# Patient Record
Sex: Male | Born: 1965 | Race: White | Hispanic: No | Marital: Single | Smoking: Current every day smoker
Health system: Southern US, Community
[De-identification: ages and names within clinical notes are randomized; demographics above are authoritative.]

## PROBLEM LIST (undated history)

## (undated) DIAGNOSIS — K7010 Alcoholic hepatitis without ascites: Secondary | ICD-10-CM

## (undated) DIAGNOSIS — K7030 Alcoholic cirrhosis of liver without ascites: Secondary | ICD-10-CM

## (undated) DIAGNOSIS — Z720 Tobacco use: Secondary | ICD-10-CM

## (undated) DIAGNOSIS — I10 Essential (primary) hypertension: Secondary | ICD-10-CM

## (undated) DIAGNOSIS — K259 Gastric ulcer, unspecified as acute or chronic, without hemorrhage or perforation: Secondary | ICD-10-CM

## (undated) DIAGNOSIS — D649 Anemia, unspecified: Secondary | ICD-10-CM

## (undated) DIAGNOSIS — R161 Splenomegaly, not elsewhere classified: Secondary | ICD-10-CM

## (undated) DIAGNOSIS — T39395A Adverse effect of other nonsteroidal anti-inflammatory drugs [NSAID], initial encounter: Secondary | ICD-10-CM

## (undated) DIAGNOSIS — D696 Thrombocytopenia, unspecified: Secondary | ICD-10-CM

## (undated) DIAGNOSIS — K760 Fatty (change of) liver, not elsewhere classified: Secondary | ICD-10-CM

## (undated) DIAGNOSIS — D689 Coagulation defect, unspecified: Secondary | ICD-10-CM

## (undated) DIAGNOSIS — F1010 Alcohol abuse, uncomplicated: Secondary | ICD-10-CM

## (undated) DIAGNOSIS — K922 Gastrointestinal hemorrhage, unspecified: Secondary | ICD-10-CM

## (undated) DIAGNOSIS — R739 Hyperglycemia, unspecified: Secondary | ICD-10-CM

## (undated) DIAGNOSIS — E46 Unspecified protein-calorie malnutrition: Secondary | ICD-10-CM

## (undated) DIAGNOSIS — E785 Hyperlipidemia, unspecified: Secondary | ICD-10-CM

## (undated) DIAGNOSIS — F418 Other specified anxiety disorders: Secondary | ICD-10-CM

## (undated) HISTORY — PX: NO PAST SURGERIES: SHX2092

## (undated) HISTORY — DX: Essential (primary) hypertension: I10

---

## 1998-04-20 ENCOUNTER — Emergency Department (HOSPITAL_COMMUNITY): Admission: EM | Admit: 1998-04-20 | Discharge: 1998-04-20 | Payer: Self-pay | Admitting: Emergency Medicine

## 2014-03-03 DIAGNOSIS — F1010 Alcohol abuse, uncomplicated: Secondary | ICD-10-CM

## 2014-03-03 DIAGNOSIS — F418 Other specified anxiety disorders: Secondary | ICD-10-CM

## 2014-03-03 HISTORY — DX: Alcohol abuse, uncomplicated: F10.10

## 2014-03-03 HISTORY — DX: Other specified anxiety disorders: F41.8

## 2014-03-22 ENCOUNTER — Encounter (HOSPITAL_BASED_OUTPATIENT_CLINIC_OR_DEPARTMENT_OTHER): Payer: Self-pay | Admitting: Emergency Medicine

## 2014-03-22 ENCOUNTER — Emergency Department (HOSPITAL_BASED_OUTPATIENT_CLINIC_OR_DEPARTMENT_OTHER)
Admission: EM | Admit: 2014-03-22 | Discharge: 2014-03-22 | Disposition: A | Discharge status: Home / Self Care | Payer: BC Managed Care – PPO | Attending: Emergency Medicine | Admitting: Emergency Medicine

## 2014-03-22 DIAGNOSIS — Z79899 Other long term (current) drug therapy: Secondary | ICD-10-CM | POA: Insufficient documentation

## 2014-03-22 DIAGNOSIS — E785 Hyperlipidemia, unspecified: Secondary | ICD-10-CM | POA: Insufficient documentation

## 2014-03-22 DIAGNOSIS — F17200 Nicotine dependence, unspecified, uncomplicated: Secondary | ICD-10-CM | POA: Insufficient documentation

## 2014-03-22 DIAGNOSIS — F411 Generalized anxiety disorder: Secondary | ICD-10-CM | POA: Insufficient documentation

## 2014-03-22 DIAGNOSIS — F419 Anxiety disorder, unspecified: Secondary | ICD-10-CM

## 2014-03-22 DIAGNOSIS — R6889 Other general symptoms and signs: Secondary | ICD-10-CM | POA: Insufficient documentation

## 2014-03-22 DIAGNOSIS — F329 Major depressive disorder, single episode, unspecified: Secondary | ICD-10-CM | POA: Insufficient documentation

## 2014-03-22 DIAGNOSIS — F3289 Other specified depressive episodes: Secondary | ICD-10-CM | POA: Insufficient documentation

## 2014-03-22 HISTORY — DX: Hyperlipidemia, unspecified: E78.5

## 2014-03-22 NOTE — ED Notes (Signed)
Pt c/o anxious and depressed,  Increased ETOh x 1 month  Denies SI and HI

## 2014-03-22 NOTE — Discharge Instructions (Signed)
Panic Attacks °Panic attacks are sudden, short-lived surges of severe anxiety, fear, or discomfort. They may occur for no reason when you are relaxed, when you are anxious, or when you are sleeping. Panic attacks may occur for a number of reasons:  °· Healthy people occasionally have panic attacks in extreme, life-threatening situations, such as war or natural disasters. Normal anxiety is a protective mechanism of the body that helps us react to danger (fight or flight response). °· Panic attacks are often seen with anxiety disorders, such as panic disorder, social anxiety disorder, generalized anxiety disorder, and phobias. Anxiety disorders cause excessive or uncontrollable anxiety. They may interfere with your relationships or other life activities. °· Panic attacks are sometimes seen with other mental illnesses such as depression and posttraumatic stress disorder. °· Certain medical conditions, prescription medicines, and drugs of abuse can cause panic attacks. °SYMPTOMS  °Panic attacks start suddenly, peak within 20 minutes, and are accompanied by four or more of the following symptoms: °· Pounding heart or fast heart rate (palpitations). °· Sweating. °· Trembling or shaking. °· Shortness of breath or feeling smothered. °· Feeling choked. °· Chest pain or discomfort. °· Nausea or strange feeling in your stomach. °· Dizziness, lightheadedness, or feeling like you will faint. °· Chills or hot flushes. °· Numbness or tingling in your lips or hands and feet. °· Feeling that things are not real or feeling that you are not yourself. °· Fear of losing control or going crazy. °· Fear of dying. °Some of these symptoms can mimic serious medical conditions. For example, you may think you are having a heart attack. Although panic attacks can be very scary, they are not life threatening. °DIAGNOSIS  °Panic attacks are diagnosed through an assessment by your health care provider. Your health care provider will ask questions  about your symptoms, such as where and when they occurred. Your health care provider will also ask about your medical history and use of alcohol and drugs, including prescription medicines. Your health care provider may order blood tests or other studies to rule out a serious medical condition. Your health care provider may refer you to a mental health professional for further evaluation. °TREATMENT  °· Most healthy people who have one or two panic attacks in an extreme, life-threatening situation will not require treatment. °· The treatment for panic attacks associated with anxiety disorders or other mental illness typically involves counseling with a mental health professional, medicine, or a combination of both. Your health care provider will help determine what treatment is best for you. °· Panic attacks due to physical illness usually goes away with treatment of the illness. If prescription medicine is causing panic attacks, talk with your health care provider about stopping the medicine, decreasing the dose, or substituting another medicine. °· Panic attacks due to alcohol or drug abuse goes away with abstinence. Some adults need professional help in order to stop drinking or using drugs. °HOME CARE INSTRUCTIONS  °· Take all your medicines as prescribed.   °· Check with your health care provider before starting new prescription or over-the-counter medicines. °· Keep all follow up appointments with your health care provider. °SEEK MEDICAL CARE IF: °· You are not able to take your medicines as prescribed. °· Your symptoms do not improve or get worse. °SEEK IMMEDIATE MEDICAL CARE IF:  °· You experience panic attack symptoms that are different than your usual symptoms. °· You have serious thoughts about hurting yourself or others. °· You are taking medicine for panic attacks and   have a serious side effect. °MAKE SURE YOU: °· Understand these instructions. °· Will watch your condition. °· Will get help right away  if you are not doing well or get worse. °Document Released: 10/20/2005 Document Revised: 08/10/2013 Document Reviewed: 06/03/2013 °ExitCare® Patient Information ©2014 [employer]. ° ° ° °Emergency Department Resource Guide °1) Find a Doctor and Pay Out of Pocket °Although you won't have to find out who is covered by your insurance plan, it is a good idea to ask around and get recommendations. You will then need to call the office and see if the doctor you have chosen will accept you as a new patient and what types of options they offer for patients who are self-pay. Some doctors offer discounts or will set up payment plans for their patients who do not have insurance, but you will need to ask so you aren't surprised when you get to your appointment. ° °2) Contact Your Local Health Department °Not all health departments have doctors that can see patients for sick visits, but many do, so it is worth a call to see if yours does. If you don't know where your local health department is, you can check in your phone book. The CDC also has a tool to help you locate your state's health department, and many state websites also have listings of all of their local health departments. ° °3) Find a Walk-in Clinic °If your illness is not likely to be very severe or complicated, you may want to try a walk in clinic. These are popping up all over the country in pharmacies, drugstores, and shopping centers. They're usually staffed by nurse practitioners or physician assistants that have been trained to treat common illnesses and complaints. They're usually fairly quick and inexpensive. However, if you have serious medical issues or chronic medical problems, these are probably not your best option. ° °No Primary Care Doctor: °- Call [facility] at  832-8000 - they can help you locate a primary care doctor that  accepts your insurance, provides certain services, etc. °- Physician Referral Service- 1-800-533-3463 ° °Chronic Pain  Problems: °Organization         Address  Phone   Notes  ° [facility]  (336) 297-2271 Patients need to be referred by their primary care doctor.  ° °Medication Assistance: °Organization         Address  Phone   Notes  °[place] [facility] [address] °[place], [place] [address] (336) 641-8030 --Must be a resident of [place] °-- Must have NO insurance coverage whatsoever (no Medicaid/ Medicare, etc.) °-- The pt. MUST have a primary care doctor that directs their care regularly and follows them in the community °  °[facility]  (866) 331-1348   °[employer]  (888) 892-1162   ° °Agencies that provide inexpensive medical care: °Organization         Address  Phone   Notes  °[facility]  (336) 832-8035   °[facility]    (336) 832-7272   °[facility] [address] °[place], [place] [address] (336) 832-4777   °[facility] [address], °[place] (336) 271-4999   °[facility]    (336) 373-0678   °[facility]    (336) 272-1050   °[facility] ° [address], [place] Phone:  (336) 832-4444, Fax:  (336) 832-4440 Hours of Operation:  9 am -   6 pm, M-F.  Also accepts Medicaid/Medicare and self-pay.  °[facility] ° [address], [place] Phone: (336) 832-3150, Fax: (336) 832-3151. Hours of Operation:  8:30 am - 5:30 pm, M-F.  Also accepts Medicaid and self-pay.  °[facility] [place] [address], [place] Phone: (336) 878-6027   °[facility] [address], [place], [place] (336)723-1848, Ext. 123 Mondays & Thursdays: 7-9 AM.  First 15 patients are seen on a first come, first serve basis. °  ° °Medicaid-accepting [place] Providers: ° °Organization         Address  Phone   Notes  °[facility] [address], [place] (336) 641-2100 Also  accepts self-pay patients.  °[facility] [address], [place] ° (336) 856-9996   °[facility] [address], [place] (336) 288-8857   °[facility] [address] [place] [address], [place] (336) 299-7000   °Veita Bland [address], [place]  ° (336) 373-1557 Only accepts [place] Access Medicaid patients after they have their name applied to their card.  ° °Self-Pay (no insurance) in [place]: ° °Organization         Address  Phone   Notes  °Sickle Cell Patients, [facility] [address], [place] (336) 832-1970   °[facility] [address], [place] (336) 832-4400   °[facility] ° [address], [place] (336) 992-4800   °[facility]/Dr. Osei-Bonsu ° [address] [place] [address], [place] or [address], [place] (336) 841-8500 Phone number for both [place] and [place] locations is the same.  °[facility] [address], [place] (336) 299-0000   °[facility] [address] [place] [address], [place] or [address] (336) 852-7530 °(336) 878-2260   °[facility] [address], [place] (336) 350-1642, phone; (336) 294-5005, fax Sees patients 1st and 3rd Saturday of every month.  Must not qualify for public or private insurance (i.e. Medicaid, Medicare, [place] Health Choice, Veterans' Benefits) • Household income should be no more than 200% of the poverty level •The clinic cannot treat you if you are pregnant or think you are pregnant • Sexually transmitted diseases are not treated at the clinic.  ° ° °Dental Care: °Organization         Address  Phone  Notes  °[facility] [address], [place] (336) 641-6152 Accepts children up to age 21 who are enrolled in Medicaid or [place] Health Choice; pregnant  women with a Medicaid card; and children who have applied for Medicaid or [place] Health Choice, but were declined, whose parents can pay a reduced fee at time of service.  °[facility] [place]  [address], [place] (336) 641-7733 Accepts children up to age 21 who are enrolled in Medicaid or [place] Health Choice; pregnant women with a Medicaid card; and children who have applied for Medicaid or [place] Health Choice, but were declined, whose parents can pay a reduced fee at time of service.  °[facility] ° [address], [place] (336) 641-4533 Patients are seen by appointment only. Walk-ins are not accepted. [facility] will see patients 18 years of age and   older. °Monday - Tuesday (8am-5pm) °Most Wednesdays (8:30-5pm) °$30 per visit, cash only  °[facility] ° [address], [place] (336) 641-4533 Patients are seen by appointment only. Walk-ins are not accepted. [facility] will see patients 18 years of age and older. °One Wednesday Evening (Monthly: Volunteer Based).  $30 per visit, cash only  °[facility]  (919) 537-3737 for adults; Children under age 4, call Graduate Pediatric Dentistry at (919) [phone]. Children aged 4-14, please call (919) [phone] to request a pediatric application. ° Dental services are provided in all areas of dental care including fillings, crowns and bridges, complete and partial dentures, implants, gum treatment, root canals, and extractions. Preventive care is also provided. Treatment is provided to both adults and children. °Patients are selected via a lottery and there is often a waiting list. °  °[facility] [address], °[place] ° (336) 763-8833 [email or web address] °  °[facility] [address], [place], [place] (336)723-1848, Ext. 123 Second and Fourth Thursday of each month, opens at 6:30 AM; Clinic ends at 9 AM.  Patients are  seen on a first-come first-served basis, and a limited number are seen during each clinic.  ° °[facility] ° [address], [place], [place] (336) 723-7904   Eligibility Requirements °You must have lived in [place], [place], or [place] counties for at least the last three months. °  You cannot be eligible for state or federal sponsored healthcare insurance, including Veterans Administration, Medicaid, or Medicare. °  You generally cannot be eligible for healthcare insurance through your employer.  °  How to apply: °Eligibility screenings are held every Tuesday and Wednesday afternoon from 1:00 pm until 4:00 pm. You do not need an appointment for the interview!  °[facility] [address], [place], [place] 336-631-2330   °[facility]  336-342-8273   °[facility]  336-703-3100   °[facility]  336-570-6415   ° °Behavioral Health Resources in the Community: °Intensive Outpatient Programs °Organization         Address  Phone  Notes  °[facility] [address], [place], [place] 336-878-6098   °[facility] [address], [place], [place] 336-832-9800   °[facility] [address], [place], [place] ° 336-882-2125   °[facility] [address],  °[place], [place] 1-800-853-5163 or 336-641-4981   °Substance Abuse Resources °Organization         Address  Phone  Notes  °[facility]  336-882-2125   °[facility]  336-784-9470   °[facility]  336-285-9073   °[facility]  336-845-3988   °Residential & Outpatient Substance Abuse Program  1-800-659-3381   °Psychological Services °Organization         Address  Phone  Notes  °[facility]  336- 832-9600   °[facility]  336- 378-7881   °[facility] [address], [place] 1-800-853-5163 or 336-641-4981   ° °Mobile Crisis  Teams °Organization         Address  Phone  Notes  °[facility], Mobile Crisis Care Unit  1-877-626-1772   °Assertive °[facility] ° [address] [place], [place] 336-834-9664   °Sharon DeEsch [address] °[place]  336-554-5454   ° °Self-Help/Support Groups °Organization         Address    Phone             Notes  °[facility] - variety of support groups  336- 373-1402 Call for more information  °Narcotics Anonymous (NA), [facility] [address], °[place]  2 meetings at this location  ° °Residential Treatment Programs °Organization         Address  Phone  Notes  °[facility] [address],    °[place]  1-866-801-8205   °[facility] ° [address], [place], [place] 704-293-8524   °[facility] [address], [place] 336-845-3988 Admissions: 8am-3pm M-F  °[facility] [address]    °[place], [place] 336-841-1104   °[facility] [address], [place], [place] 336-379-7146   °[facility] [address]  °[place], [place] 336-285-9073   °[facility] - Intensive Outpatient [address], [place], [place] 336-852-3033   °[facility] [address]  °[place], [place] 1-877-615-2722 or 336-784-9470   °[facility] [address] [place], [place] 336-227-7417 Accepts Medicaid  °[facility] [address]  ° [place] 1-800-659-3381 Substance Abuse/Addiction Treatment  ° °[place] Behavioral Health Resources °Organization         Address  Phone  Notes  °[facility]  (888) 581-9988   °Julie Brannon, PhD [address] [place], [place]   (336) 349-5553 or (336) 951-0000   °[facility]   [address] °[place], [place] (336) 349-4454   °[facility] [address], [place], [place] (336) 342-8316  Insurance/Medicaid/sponsorship through [facility]  °[facility] [address]                                    [place], [place] (336) 342-8316 Therapy/tele-psych/case  °[facility] [address]  ° [place], [place] (336) 349-2233    °Dr. Arfeen  (336) 349-4544   °[facility] [place]  [employer] [place] [facility] 1) [address],  °2) [address], [place] °3)  [address], [place] (336) 349-3220 °(336) 342-7768 ° °(336) 342-8140   °[place] [facility] (336) 342-1394 or (336) 342-3537 (After Hours)    ° ° ° °

## 2014-03-22 NOTE — ED Provider Notes (Signed)
CSN: [id number]     Arrival date & time 03/22/14  1546 History   First MD Initiated Contact with Patient 03/22/14 1622     Chief Complaint  Patient presents with  . Anxiety     (Consider location/radiation/quality/duration/timing/severity/associated sxs/prior Treatment) Patient is a 48 y.o. male presenting with mental health disorder.  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  since that time, he has been more anxious, drinking more, smoking more, and recently has been unable to go back to work.   Relieved by:  Nothing Ineffective treatments: cymbalta. Associated symptoms: anhedonia, anxiety and feelings of worthlessness   Associated symptoms: no abdominal pain and no chest pain   Associated symptoms comment:  No hallucinations, no bizarre behavior, no SI.   Past Medical History  Diagnosis Date  . Hyperlipemia   . Anxiety    History reviewed. No pertinent past surgical history. History reviewed. No pertinent family history. History  Substance Use Topics  . Smoking status: Current Every Day Smoker -- 2.00 packs/day    Types: Cigarettes  . Smokeless tobacco: Not on file  . Alcohol Use: No    Review of Systems  Constitutional: Negative for fever.  HENT: Negative for congestion.   Respiratory: Negative for cough and shortness of breath.   Cardiovascular: Negative for chest pain.  Gastrointestinal: Negative for nausea, vomiting, abdominal pain and diarrhea.  Psychiatric/Behavioral: The patient is nervous/anxious.   All other systems reviewed and are negative.     Allergies  Review of patient's allergies indicates no known allergies.  Home Medications   Prior to Admission  medications   Medication Sig Start Date End Date Taking? Authorizing Provider  DULoxetine (CYMBALTA) 30 MG capsule Take 30 mg by mouth daily.   Yes Historical Provider, MD  simvastatin (ZOCOR) 20 MG tablet Take 20 mg by mouth daily.   Yes Historical Provider, MD   BP 161/100  Pulse 95  Temp(Src) 98.5 F (36.9 C) (Oral)  Resp 16  Ht 5\' 11"  (1.803 m)  Wt 150 lb (68.04 kg)  BMI 20.93 kg/m2  SpO2 100% Physical Exam  Nursing note and vitals reviewed. Constitutional: He is oriented to person, place, and time. He appears well-developed and well-nourished. No distress.  HENT:  Head: Normocephalic and atraumatic.  Eyes: Conjunctivae are normal. No scleral icterus.  Neck: Neck supple.  Cardiovascular: Normal rate and intact distal pulses.   Pulmonary/Chest: Effort normal. No stridor. No respiratory distress.  Abdominal: Normal appearance. He exhibits no distension.  Neurological: He is alert and oriented to person, place, and time.  Skin: Skin is warm and dry. No rash noted.  Psychiatric: His affect is blunt. He is withdrawn. He expresses no suicidal ideation. He expresses no suicidal plans.    ED Course  Procedures (including critical care time) Labs Review Labs Reviewed - No data to display  Imaging Review No results found.   EKG Interpretation None      MDM   Final diagnoses:  Anxiety    48 year old male with history of depression and anxiety presents with worsening symptoms over the past month. They seem to stem from an episode at work, which he won't explain in much detail.  He denies SI/HI, auditory or visual hallucinations,  and bizarre or paranoid behavior.  He does not have an indication for emergent psychiatric evaluation. However, have given him community resources to establish outpatient psychiatric care.    Candyce ChurnJohn David Nandana Krolikowski III, MD 03/22/14 [phone]

## 2015-09-04 DIAGNOSIS — R739 Hyperglycemia, unspecified: Secondary | ICD-10-CM

## 2015-09-04 DIAGNOSIS — K7010 Alcoholic hepatitis without ascites: Secondary | ICD-10-CM

## 2015-09-04 HISTORY — DX: Hyperglycemia, unspecified: R73.9

## 2015-09-04 HISTORY — DX: Alcoholic hepatitis without ascites: K70.10

## 2015-09-19 ENCOUNTER — Ambulatory Visit (INDEPENDENT_AMBULATORY_CARE_PROVIDER_SITE_OTHER): Payer: Self-pay | Admitting: Family Medicine

## 2015-09-19 VITALS — BP 120/86 | HR 96 | Temp 97.9°F | Resp 18 | Ht 71.0 in | Wt 153.0 lb

## 2015-09-19 DIAGNOSIS — G629 Polyneuropathy, unspecified: Secondary | ICD-10-CM

## 2015-09-19 DIAGNOSIS — F1020 Alcohol dependence, uncomplicated: Secondary | ICD-10-CM

## 2015-09-19 DIAGNOSIS — R270 Ataxia, unspecified: Secondary | ICD-10-CM

## 2015-09-19 LAB — POCT CBC
Granulocyte percent: 53.8 %G (ref 37–80)
HCT, POC: 34.2 % — AB (ref 43.5–53.7)
Hemoglobin: 12 g/dL — AB (ref 14.1–18.1)
Lymph, poc: 3.5 — AB (ref 0.6–3.4)
MCH, POC: 40.2 pg — AB (ref 27–31.2)
MCHC: 35.1 g/dL (ref 31.8–35.4)
MCV: 114.5 fL — AB (ref 80–97)
MID (cbc): 0.5 (ref 0–0.9)
MPV: 7.3 fL (ref 0–99.8)
POC Granulocyte: 4.6 (ref 2–6.9)
POC LYMPH PERCENT: 40.9 %L (ref 10–50)
POC MID %: 5.3 %M (ref 0–12)
Platelet Count, POC: 259 10*3/uL (ref 142–424)
RBC: 2.99 M/uL — AB (ref 4.69–6.13)
RDW, POC: 16.5 %
WBC: 8.5 10*3/uL (ref 4.6–10.2)

## 2015-09-19 MED ORDER — GABAPENTIN 100 MG PO CAPS: 100.0000 mg | ORAL_CAPSULE | Freq: Three times a day (TID) | ORAL | Status: DC

## 2015-09-19 MED ORDER — FLUOXETINE HCL 20 MG PO TABS: 20.0000 mg | ORAL_TABLET | Freq: Every day | ORAL | Status: AC

## 2015-09-19 NOTE — Progress Notes (Addendum)
 @  This chart was scribed for Bryan Sidle, MD by Bryan Moyer, ED Scribe. This patient was seen in room 1 and the patient's care was started at 7:38 PM.  Patient ID: Bryan Moyer MRN: [id number], DOB: [birth year], 49 y.o. Date of Encounter: 09/19/2015, 7:38 PM  Primary Physician: No PCP Per Patient  Chief Complaint:  Chief Complaint  Patient presents with   Dizziness    Pt. states that he can only take a few steps without getting dizziness   Gout    both feet, feet have sore for several days     HPI: 49 y.o. year old male with history below presents with multiple concerns.  He has not eaten well in a couple of days. He is currently unemployed and lives at home with his mom and sister He drinks vodka and goes through about a couple a week. Pt states he will start drinking in the afternoon. He has not tried to quit drinking. His sister feels that he drinks too much and drinking has worsened since losing his job,03/2014. He used to a Marine scientist. His sister feels as if pt has issues with anxiety since losing his job. He has been on cymbalta in the past. Pt has bilateral foot pain to the bottom of his toes. He believes this may be gout.   He also reports dizziness with walking short distance; feeling off balance.    Past Medical History  Diagnosis Date   Hyperlipemia    Anxiety    Depression    Hypertension      Home Meds: Prior to Admission medications   Medication Sig Start Date End Date Taking? Authorizing Provider  DULoxetine (CYMBALTA) 30 MG capsule Take 30 mg by mouth daily.    Historical Provider, MD  simvastatin (ZOCOR) 20 MG tablet Take 20 mg by mouth daily.    Historical Provider, MD    Allergies: No Known Allergies  Social History   Social History   Marital Status: Single    Spouse Name: N/A   Number of Children: N/A   Years of Education: N/A   Occupational History   Not on file.   Social History Main Topics   Smoking status:  Current Every Day Smoker -- 2.00 packs/day    Types: Cigarettes   Smokeless tobacco: Not on file   Alcohol Use: 16.8 oz/week    28 Standard drinks or equivalent per week   Drug Use: No   Sexual Activity: No   Other Topics Concern   Not on file   Social History Narrative     Review of Systems: Constitutional: negative for chills, fever, night sweats, weight changes, or fatigue  HEENT: negative for vision changes, hearing loss, congestion, rhinorrhea, ST, epistaxis, or sinus pressure Cardiovascular: negative for chest pain or palpitations Respiratory: negative for hemoptysis, wheezing, shortness of breath, or cough Abdominal: negative for abdominal pain, nausea, vomiting, diarrhea, or constipation Dermatological: negative for rash Neurologic: negative for headache, or syncope All other systems reviewed and are otherwise negative with the exception to those above and in the HPI.   Physical Exam:  Patient is ashen and weak Blood pressure 120/86, pulse 96, temperature 97.9 F (36.6 C), temperature source Oral, resp. rate 18, height  (1.803 m), weight 153 lb (69.4 kg), SpO2 98 %., Body mass index is 21.35 kg/(m^2). General: He is pale and skin is doughy with diffuse muscle wasting Head: Normocephalic, atraumatic, eyes without discharge, sclera non-icteric, nares are without discharge. Bilateral auditory canals  clear, TM's are without perforation, pearly grey and translucent with reflective cone of light bilaterally. Oral cavity moist, posterior pharynx without exudate, erythema, peritonsillar abscess, or post nasal drip.  Neck: Supple. No thyromegaly. Full ROM. No lymphadenopathy.  No JVD Lungs: Clear bilaterally to auscultation without wheezes, rales, or rhonchi. Breathing is unlabored. Heart: RRR with S1 S2. No murmurs, rubs, or gallops appreciated. Abdomen: Soft, non-tender, non-distended with normoactive bowel sounds. Liver is enlarged and irregular, firm and nontender.  No  obvious ascites. Msk:  Strength and tone normal for age. Extremities/Skin: Warm and dry. No clubbing or cyanosis. No edema. No rashes or suspicious lesions.  Nicotine stains on finger nails Neuro: Alert and oriented X 3. Moves all extremities spontaneously. Gait is normal. CNII-XII grossly in tact. Psych:  Responds to questions appropriately with a normal affect.   Labs: Results for orders placed or performed in visit on 09/19/15  POCT CBC  Result Value Ref Range   WBC 8.5 4.6 - 10.2 K/uL   Lymph, poc 3.5 (A) 0.6 - 3.4   POC LYMPH PERCENT 40.9 10 - 50 %L   MID (cbc) 0.5 0 - 0.9   POC MID % 5.3 0 - 12 %M   POC Granulocyte 4.6 2 - 6.9   Granulocyte percent 53.8 37 - 80 %G   RBC 2.99 (A) 4.69 - 6.13 M/uL   Hemoglobin 12.0 (A) 14.1 - 18.1 g/dL   HCT, POC 40.934.2 (A) 81.143.5 - 53.7 %   MCV 114.5 (A) 80 - 97 fL   MCH, POC 40.2 (A) 27 - 31.2 pg   MCHC 35.1 31.8 - 35.4 g/dL   RDW, POC 91.416.5 %   Platelet Count, POC 259 142 - 424 K/uL   MPV 7.3 0 - 99.8 fL      ASSESSMENT AND PLAN:  49 y.o. year old male with  Alcoholism. Tried to explain that all of his symptoms are coming from alcohol and that in order to get better he needs to go to facility that deals with alcoholics.  He is obviously weak and needs to make some serious decisions about how he wants to proceed.  Labs for liver will be drawn and follow up recommended according to the results. This chart was scribed in my presence and reviewed by me personally.    ICD-9-CM ICD-10-CM   1. Alcoholism (HCC) 303.90 F10.20 POCT CBC     COMPLETE METABOLIC PANEL WITH GFR     TSH  2. Neuropathy (HCC) 355.9 G62.9 POCT CBC     COMPLETE METABOLIC PANEL WITH GFR     TSH  3. Ataxia 781.3 R27.0        By signing my name below, I, Raven Small, attest that this documentation has been prepared under the direction and in the presence of Bryan SidleKurt Lauenstein, MD.  Electronically Signed: Andrew Auaven Small, ED Scribe. 09/19/2015. 7:49 PM.  Signed, Bryan SidleKurt  Lauenstein, MD 09/19/2015 7:38 PM

## 2015-09-19 NOTE — Patient Instructions (Addendum)
Your problems all stem from drinking heavily. If you want to problems to go away, he'll have to stop drinking alcohol.   As we discussed, if you discontinue alcohol suddenly , you have a very likely chance of getting a seizure. I strongly urge [facility] to [facility].   I'm prescribing some medicine for the neuropathy in your feet.  There are number of labs that are still pending and that should be back tomorrow or Friday.    your condition is serious. All do everything I can to help alleviate her anxiety and neuropathy but the ball squarely in your court as far as alcohol goes.   as I have mentioned, very important to start on vitamins. Centrum would be a good one to start with.

## 2015-09-20 ENCOUNTER — Other Ambulatory Visit: Payer: Self-pay | Admitting: Family Medicine

## 2015-09-20 ENCOUNTER — Telehealth: Payer: Self-pay

## 2015-09-20 DIAGNOSIS — E876 Hypokalemia: Secondary | ICD-10-CM

## 2015-09-20 LAB — COMPLETE METABOLIC PANEL WITH GFR
ALT: 38 U/L (ref 9–46)
AST: 91 U/L — ABNORMAL HIGH (ref 10–35)
Albumin: 2.9 g/dL — ABNORMAL LOW (ref 3.6–5.1)
Alkaline Phosphatase: 158 U/L — ABNORMAL HIGH (ref 40–115)
BUN: 5 mg/dL — ABNORMAL LOW (ref 7–25)
CO2: 29 mmol/L (ref 20–31)
Calcium: 8.7 mg/dL (ref 8.6–10.3)
Chloride: 89 mmol/L — ABNORMAL LOW (ref 98–110)
Creat: 0.55 mg/dL — ABNORMAL LOW (ref 0.70–1.33)
GFR, Est African American: >89 mL/min (ref >=60)
GFR, Est Non African American: >89 mL/min (ref >=60)
Glucose, Bld: 169 mg/dL — ABNORMAL HIGH (ref 65–99)
Potassium: 2.4 mmol/L — CL (ref 3.5–5.3)
Sodium: 133 mmol/L — ABNORMAL LOW (ref 135–146)
Total Bilirubin: 2.2 mg/dL — ABNORMAL HIGH (ref 0.2–1.2)
Total Protein: 6.2 g/dL (ref 6.1–8.1)

## 2015-09-20 LAB — TSH: TSH: 0.847 u[IU]/mL (ref 0.350–4.500)

## 2015-09-20 MED ORDER — POTASSIUM CHLORIDE CRYS ER 20 MEQ PO TBCR: 20.0000 meq | EXTENDED_RELEASE_TABLET | Freq: Every day | ORAL | Status: DC

## 2015-09-20 NOTE — Telephone Encounter (Signed)
[facility] called to report a critical lab value from yesterday. K+ value = 2.4. Spoke to Dr Milus GlazierLauenstein to advise, and will also forward this message.

## 2015-10-02 ENCOUNTER — Encounter (HOSPITAL_COMMUNITY): Payer: Self-pay | Admitting: Family Medicine

## 2015-10-02 ENCOUNTER — Inpatient Hospital Stay (HOSPITAL_COMMUNITY)
Admission: EM | Admit: 2015-10-02 | Discharge: 2015-10-05 | DRG: 312 | Disposition: A | Discharge status: Home / Self Care | Payer: Self-pay | Attending: Internal Medicine | Admitting: Internal Medicine

## 2015-10-02 DIAGNOSIS — R42 Dizziness and giddiness: Secondary | ICD-10-CM

## 2015-10-02 DIAGNOSIS — I951 Orthostatic hypotension: Principal | ICD-10-CM | POA: Diagnosis present

## 2015-10-02 DIAGNOSIS — E876 Hypokalemia: Secondary | ICD-10-CM | POA: Diagnosis present

## 2015-10-02 DIAGNOSIS — G621 Alcoholic polyneuropathy: Secondary | ICD-10-CM | POA: Diagnosis present

## 2015-10-02 DIAGNOSIS — Z8249 Family history of ischemic heart disease and other diseases of the circulatory system: Secondary | ICD-10-CM

## 2015-10-02 DIAGNOSIS — F17210 Nicotine dependence, cigarettes, uncomplicated: Secondary | ICD-10-CM | POA: Diagnosis present

## 2015-10-02 DIAGNOSIS — F1010 Alcohol abuse, uncomplicated: Secondary | ICD-10-CM | POA: Diagnosis present

## 2015-10-02 DIAGNOSIS — F329 Major depressive disorder, single episode, unspecified: Secondary | ICD-10-CM | POA: Diagnosis present

## 2015-10-02 DIAGNOSIS — M6250 Muscle wasting and atrophy, not elsewhere classified, unspecified site: Secondary | ICD-10-CM | POA: Diagnosis present

## 2015-10-02 DIAGNOSIS — R718 Other abnormality of red blood cells: Secondary | ICD-10-CM | POA: Diagnosis present

## 2015-10-02 DIAGNOSIS — R945 Abnormal results of liver function studies: Secondary | ICD-10-CM | POA: Diagnosis present

## 2015-10-02 DIAGNOSIS — N390 Urinary tract infection, site not specified: Secondary | ICD-10-CM | POA: Diagnosis present

## 2015-10-02 DIAGNOSIS — F32A Depression, unspecified: Secondary | ICD-10-CM

## 2015-10-02 DIAGNOSIS — E440 Moderate protein-calorie malnutrition: Secondary | ICD-10-CM | POA: Diagnosis present

## 2015-10-02 DIAGNOSIS — I10 Essential (primary) hypertension: Secondary | ICD-10-CM | POA: Diagnosis present

## 2015-10-02 DIAGNOSIS — Z716 Tobacco abuse counseling: Secondary | ICD-10-CM

## 2015-10-02 DIAGNOSIS — D7589 Other specified diseases of blood and blood-forming organs: Secondary | ICD-10-CM | POA: Diagnosis present

## 2015-10-02 DIAGNOSIS — E785 Hyperlipidemia, unspecified: Secondary | ICD-10-CM | POA: Diagnosis present

## 2015-10-02 DIAGNOSIS — G629 Polyneuropathy, unspecified: Secondary | ICD-10-CM | POA: Diagnosis present

## 2015-10-02 DIAGNOSIS — Z720 Tobacco use: Secondary | ICD-10-CM | POA: Diagnosis present

## 2015-10-02 DIAGNOSIS — Z823 Family history of stroke: Secondary | ICD-10-CM

## 2015-10-02 DIAGNOSIS — E860 Dehydration: Secondary | ICD-10-CM | POA: Diagnosis present

## 2015-10-02 DIAGNOSIS — Z79899 Other long term (current) drug therapy: Secondary | ICD-10-CM

## 2015-10-02 DIAGNOSIS — R7989 Other specified abnormal findings of blood chemistry: Secondary | ICD-10-CM | POA: Diagnosis present

## 2015-10-02 DIAGNOSIS — F419 Anxiety disorder, unspecified: Secondary | ICD-10-CM | POA: Diagnosis present

## 2015-10-02 HISTORY — DX: Tobacco use: Z72.0

## 2015-10-02 LAB — URINALYSIS, ROUTINE W REFLEX MICROSCOPIC
Glucose, UA: NEGATIVE mg/dL
Hgb urine dipstick: NEGATIVE
Ketones, ur: NEGATIVE mg/dL
Nitrite: POSITIVE — AB
Protein, ur: 30 mg/dL — AB
SPECIFIC GRAVITY, URINE: 1.017 (ref 1.005–1.030)
pH: 8.5 — ABNORMAL HIGH (ref 5.0–8.0)

## 2015-10-02 LAB — URINE MICROSCOPIC-ADD ON: BACTERIA UA: NONE SEEN

## 2015-10-02 LAB — CBC
HCT: 37.7 % — ABNORMAL LOW (ref 39.0–52.0)
HEMOGLOBIN: 13 g/dL (ref 13.0–17.0)
MCH: 40.9 pg — AB (ref 26.0–34.0)
MCHC: 34.5 g/dL (ref 30.0–36.0)
MCV: 118.6 fL — AB (ref 78.0–100.0)
Platelets: 234 10*3/uL (ref 150–400)
RBC: 3.18 MIL/uL — AB (ref 4.22–5.81)
RDW: 13.8 % (ref 11.5–15.5)
WBC: 6.6 10*3/uL (ref 4.0–10.5)

## 2015-10-02 NOTE — ED Notes (Signed)
Pt is complaining of dizziness when moving from a sitting to standing position or when ambulating for the last two months.

## 2015-10-03 ENCOUNTER — Telehealth: Payer: Self-pay

## 2015-10-03 ENCOUNTER — Emergency Department (HOSPITAL_COMMUNITY): Payer: Self-pay

## 2015-10-03 ENCOUNTER — Encounter (HOSPITAL_COMMUNITY): Payer: Self-pay | Admitting: Internal Medicine

## 2015-10-03 DIAGNOSIS — Z720 Tobacco use: Secondary | ICD-10-CM | POA: Diagnosis present

## 2015-10-03 DIAGNOSIS — F32A Depression, unspecified: Secondary | ICD-10-CM | POA: Diagnosis present

## 2015-10-03 DIAGNOSIS — F329 Major depressive disorder, single episode, unspecified: Secondary | ICD-10-CM | POA: Diagnosis present

## 2015-10-03 DIAGNOSIS — I951 Orthostatic hypotension: Secondary | ICD-10-CM | POA: Diagnosis present

## 2015-10-03 DIAGNOSIS — N390 Urinary tract infection, site not specified: Secondary | ICD-10-CM | POA: Diagnosis present

## 2015-10-03 DIAGNOSIS — R945 Abnormal results of liver function studies: Secondary | ICD-10-CM | POA: Diagnosis present

## 2015-10-03 DIAGNOSIS — E785 Hyperlipidemia, unspecified: Secondary | ICD-10-CM | POA: Diagnosis present

## 2015-10-03 DIAGNOSIS — F1010 Alcohol abuse, uncomplicated: Secondary | ICD-10-CM | POA: Diagnosis present

## 2015-10-03 DIAGNOSIS — F419 Anxiety disorder, unspecified: Secondary | ICD-10-CM | POA: Diagnosis present

## 2015-10-03 DIAGNOSIS — I10 Essential (primary) hypertension: Secondary | ICD-10-CM | POA: Diagnosis present

## 2015-10-03 DIAGNOSIS — E440 Moderate protein-calorie malnutrition: Secondary | ICD-10-CM | POA: Diagnosis present

## 2015-10-03 DIAGNOSIS — D7589 Other specified diseases of blood and blood-forming organs: Secondary | ICD-10-CM | POA: Insufficient documentation

## 2015-10-03 DIAGNOSIS — R7989 Other specified abnormal findings of blood chemistry: Secondary | ICD-10-CM | POA: Diagnosis present

## 2015-10-03 DIAGNOSIS — R42 Dizziness and giddiness: Secondary | ICD-10-CM

## 2015-10-03 LAB — BASIC METABOLIC PANEL
ANION GAP: 13 (ref 5–15)
BUN: 6 mg/dL (ref 6–20)
CALCIUM: 8.8 mg/dL — AB (ref 8.9–10.3)
CO2: 29 mmol/L (ref 22–32)
Chloride: 97 mmol/L — ABNORMAL LOW (ref 101–111)
Creatinine, Ser: 0.65 mg/dL (ref 0.61–1.24)
GFR calc non Af Amer: >60 mL/min (ref >60)
GLUCOSE: 190 mg/dL — AB (ref 65–99)
POTASSIUM: 3.2 mmol/L — AB (ref 3.5–5.1)
SODIUM: 139 mmol/L (ref 135–145)

## 2015-10-03 LAB — HIV ANTIBODY (ROUTINE TESTING W REFLEX): HIV SCREEN 4TH GENERATION: NONREACTIVE

## 2015-10-03 LAB — HEPATIC FUNCTION PANEL
ALBUMIN: 2.7 g/dL — AB (ref 3.5–5.0)
ALT: 90 U/L — ABNORMAL HIGH (ref 17–63)
AST: 216 U/L — AB (ref 15–41)
Alkaline Phosphatase: 270 U/L — ABNORMAL HIGH (ref 38–126)
Bilirubin, Direct: 1.1 mg/dL — ABNORMAL HIGH (ref 0.1–0.5)
Indirect Bilirubin: 1.3 mg/dL — ABNORMAL HIGH (ref 0.3–0.9)
TOTAL PROTEIN: 6.7 g/dL (ref 6.5–8.1)
Total Bilirubin: 2.4 mg/dL — ABNORMAL HIGH (ref 0.3–1.2)

## 2015-10-03 LAB — DIFFERENTIAL
BASOS ABS: 0 10*3/uL (ref 0.0–0.1)
Basophils Relative: 0 %
Eosinophils Absolute: 0 10*3/uL (ref 0.0–0.7)
Eosinophils Relative: 1 %
LYMPHS PCT: 32 %
Lymphs Abs: 2.2 10*3/uL (ref 0.7–4.0)
Monocytes Absolute: 0.6 10*3/uL (ref 0.1–1.0)
Monocytes Relative: 8 %
NEUTROS ABS: 3.9 10*3/uL (ref 1.7–7.7)
NEUTROS PCT: 59 %

## 2015-10-03 LAB — RAPID URINE DRUG SCREEN, HOSP PERFORMED
Amphetamines: NOT DETECTED
BENZODIAZEPINES: NOT DETECTED
Barbiturates: NOT DETECTED
Cocaine: NOT DETECTED
Opiates: NOT DETECTED
Tetrahydrocannabinol: NOT DETECTED

## 2015-10-03 LAB — GLUCOSE, CAPILLARY: GLUCOSE-CAPILLARY: 140 mg/dL — AB (ref 65–99)

## 2015-10-03 LAB — PROTIME-INR
INR: 0.95 (ref 0.00–1.49)
PROTHROMBIN TIME: 12.9 s (ref 11.6–15.2)

## 2015-10-03 LAB — CK: CK TOTAL: 19 U/L — AB (ref 49–397)

## 2015-10-03 LAB — VITAMIN B12: Vitamin B-12: 843 pg/mL (ref 180–914)

## 2015-10-03 LAB — FOLATE: Folate: 4.2 ng/mL — ABNORMAL LOW (ref >5.9)

## 2015-10-03 LAB — APTT: aPTT: 29 seconds (ref 24–37)

## 2015-10-03 MED ORDER — VITAMIN B-1 100 MG PO TABS
100.0000 mg | ORAL_TABLET | Freq: Every day | ORAL | Status: DC
  Administered 2015-10-03 – 2015-10-05 (×3): 100 mg via ORAL
  Filled 2015-10-03 (×3): qty 1

## 2015-10-03 MED ORDER — LORAZEPAM 2 MG/ML IJ SOLN: 1.0000 mg | Freq: Four times a day (QID) | INTRAMUSCULAR | Status: DC | PRN

## 2015-10-03 MED ORDER — SODIUM CHLORIDE 0.9 % IV BOLUS (SEPSIS)
1000.0000 mL | Freq: Once | INTRAVENOUS | Status: AC
  Administered 2015-10-03: 1000 mL via INTRAVENOUS

## 2015-10-03 MED ORDER — LORAZEPAM 2 MG/ML IJ SOLN: 0.0000 mg | Freq: Two times a day (BID) | INTRAMUSCULAR | Status: DC

## 2015-10-03 MED ORDER — ONDANSETRON HCL 4 MG/2ML IJ SOLN: 4.0000 mg | Freq: Four times a day (QID) | INTRAMUSCULAR | Status: DC | PRN

## 2015-10-03 MED ORDER — FOSFOMYCIN TROMETHAMINE 3 G PO PACK
3.0000 g | PACK | Freq: Once | ORAL | Status: AC
  Administered 2015-10-03: 3 g via ORAL
  Filled 2015-10-03: qty 3

## 2015-10-03 MED ORDER — GUAIFENESIN ER 600 MG PO TB12
600.0000 mg | ORAL_TABLET | Freq: Two times a day (BID) | ORAL | Status: DC
  Administered 2015-10-03 – 2015-10-05 (×4): 600 mg via ORAL
  Filled 2015-10-03 (×4): qty 1

## 2015-10-03 MED ORDER — SODIUM CHLORIDE 0.9 % IV SOLN
INTRAVENOUS | Status: DC
  Administered 2015-10-03 – 2015-10-05 (×3): via INTRAVENOUS

## 2015-10-03 MED ORDER — HEPARIN SODIUM (PORCINE) 5000 UNIT/ML IJ SOLN
5000.0000 [IU] | Freq: Three times a day (TID) | INTRAMUSCULAR | Status: DC
  Administered 2015-10-03 – 2015-10-05 (×7): 5000 [IU] via SUBCUTANEOUS
  Filled 2015-10-03 (×8): qty 1

## 2015-10-03 MED ORDER — ONDANSETRON HCL 4 MG PO TABS: 4.0000 mg | ORAL_TABLET | Freq: Four times a day (QID) | ORAL | Status: DC | PRN

## 2015-10-03 MED ORDER — POTASSIUM CHLORIDE CRYS ER 20 MEQ PO TBCR
40.0000 meq | EXTENDED_RELEASE_TABLET | Freq: Once | ORAL | Status: AC
  Administered 2015-10-03: 40 meq via ORAL
  Filled 2015-10-03: qty 2

## 2015-10-03 MED ORDER — LORAZEPAM 1 MG PO TABS: 1.0000 mg | ORAL_TABLET | Freq: Four times a day (QID) | ORAL | Status: DC | PRN

## 2015-10-03 MED ORDER — SODIUM CHLORIDE 0.9 % IJ SOLN
3.0000 mL | Freq: Two times a day (BID) | INTRAMUSCULAR | Status: DC
  Administered 2015-10-04: 3 mL via INTRAVENOUS

## 2015-10-03 MED ORDER — ENSURE ENLIVE PO LIQD
237.0000 mL | Freq: Two times a day (BID) | ORAL | Status: DC
  Administered 2015-10-03: 237 mL via ORAL

## 2015-10-03 MED ORDER — FLUOXETINE HCL 20 MG PO CAPS
20.0000 mg | ORAL_CAPSULE | Freq: Every day | ORAL | Status: DC
  Administered 2015-10-03 – 2015-10-05 (×3): 20 mg via ORAL
  Filled 2015-10-03 (×3): qty 1

## 2015-10-03 MED ORDER — ADULT MULTIVITAMIN W/MINERALS CH
1.0000 | ORAL_TABLET | Freq: Every day | ORAL | Status: DC
  Administered 2015-10-03 – 2015-10-05 (×3): 1 via ORAL
  Filled 2015-10-03 (×3): qty 1

## 2015-10-03 MED ORDER — NICOTINE 21 MG/24HR TD PT24
21.0000 mg | MEDICATED_PATCH | Freq: Every day | TRANSDERMAL | Status: DC
  Administered 2015-10-03 – 2015-10-05 (×3): 21 mg via TRANSDERMAL
  Filled 2015-10-03 (×4): qty 1

## 2015-10-03 MED ORDER — FOLIC ACID 1 MG PO TABS
1.0000 mg | ORAL_TABLET | Freq: Every day | ORAL | Status: DC
  Administered 2015-10-03 – 2015-10-05 (×3): 1 mg via ORAL
  Filled 2015-10-03 (×3): qty 1

## 2015-10-03 MED ORDER — THIAMINE HCL 100 MG/ML IJ SOLN
100.0000 mg | Freq: Every day | INTRAMUSCULAR | Status: DC
  Filled 2015-10-03 (×3): qty 1

## 2015-10-03 MED ORDER — LORAZEPAM 2 MG/ML IJ SOLN
0.0000 mg | Freq: Four times a day (QID) | INTRAMUSCULAR | Status: AC
  Administered 2015-10-03 – 2015-10-04 (×2): 2 mg via INTRAVENOUS
  Filled 2015-10-03 (×2): qty 1

## 2015-10-03 MED ORDER — GABAPENTIN 100 MG PO CAPS
100.0000 mg | ORAL_CAPSULE | Freq: Three times a day (TID) | ORAL | Status: DC
  Administered 2015-10-03 – 2015-10-05 (×7): 100 mg via ORAL
  Filled 2015-10-03 (×8): qty 1

## 2015-10-03 NOTE — ED Notes (Signed)
Pt states he has been dizzy for 2 months and feeling overall "weak". Pt states there are no other accompanying symptoms. Pt has an unremarkable neuro assessment.

## 2015-10-03 NOTE — ED Notes (Signed)
Pt to xray

## 2015-10-03 NOTE — Care Management Note (Signed)
Case Management Note  Patient Details  Name: Bryan Moyer MRN: [id number] Date of Birth: [birth year]  Subjective/Objective: 49 y/o m admitted w/dizziness, etoh.From home.No pcp,no health insurance. Will provide pcp resource,& community resources, [employer] med list.                   Action/Plan:d/c plan home.   Expected Discharge Date:                  Expected Discharge Plan:     In-House Referral:     Discharge planning Services     Post Acute Care Choice:    Choice offered to:     DME Arranged:    DME Agency:     HH Arranged:    HH Agency:     Status of Service:     Medicare Important Message Given:    Date Medicare IM Given:    Medicare IM give by:    Date Additional Medicare IM Given:    Additional Medicare Important Message give by:     If discussed at Long Length of Stay Meetings, dates discussed:    Additional Comments:  Lanier ClamMahabir, Jaira Canady, RN 10/03/2015, 1:21 PM

## 2015-10-03 NOTE — Evaluation (Signed)
Occupational Therapy Evaluation Patient Details Name: Bryan Moyer MRN: [id number] DOB: January 29, 1966 Today's Date: 10/03/2015    History of Present Illness pt was admitted for dizziness.  He has a PMH significant for HTN, anxiety, depression, ETOH, peripheral neuropathy   Clinical Impression   This 49 year old man was admitted for the above.  Pt was independent with adls prior to admission but he does report falls because of dizziness.  At time of evaluation, pt did not experience any dizziness although he had orthostatic hypotension with standing (asymptomatic).  Will follow in acute with supervision to min guard level goals.  Pt currently needs min A overall    Follow Up Recommendations  SNF;Supervision/Assistance - 24 hour (vs depending upon progress/family's ability to assist)    Equipment Recommendations   (to be further assessed, possibly 3:1 for over toilet)    Recommendations for Other Services       Precautions / Restrictions Precautions Precautions: Fall Restrictions Weight Bearing Restrictions: No      Mobility Bed Mobility Overal bed mobility: Needs Assistance Bed Mobility: Supine to Sit     Supine to sit: Min assist     General bed mobility comments: assist for trunk to sit up; supervision for lying back in bed  Transfers Overall transfer level: Needs assistance Equipment used:  (used IV pole) Transfers: Sit to/from Stand Sit to Stand: Min guard;Min assist         General transfer comment: min A from bed; min guard from comfort height commode with use of grab bar.  Assist to power up from bed; steadying assistance provided    Balance Overall balance assessment: History of Falls                                          ADL Overall ADL's : Needs assistance/impaired             Lower Body Bathing: Minimal assistance;Sit to/from stand       Lower Body Dressing: Minimal assistance;Sit to/from stand   Toilet Transfer:  Minimal assistance;Comfort height toilet;Ambulation;Grab bars   Toileting- Clothing Manipulation and Hygiene: Min guard;Sit to/from stand         General ADL Comments: pt needed extra time to power up from bed.  Pt was orthostatic with standing but asymptomatic--see vitals section of chart.  He wanted to walk to bathroom and use toilet and he remained asymptomatic.  Pt was able to move head in all directions and VOR was WFLs.  He reports that sometimes he feels lightheaded and sometimes room spins--neither occurred during this evaluation.  Pt is able to perform UB adls with set up     Vision     Perception     Praxis      Pertinent Vitals/Pain Pain Assessment: No/denies pain     Hand Dominance     Extremity/Trunk Assessment Upper Extremity Assessment Upper Extremity Assessment: Generalized weakness           Communication Communication Communication: No difficulties   Cognition Arousal/Alertness: Awake/alert Behavior During Therapy: WFL for tasks assessed/performed Overall Cognitive Status: Within Functional Limits for tasks assessed                     General Comments       Exercises       Shoulder Instructions      Home Living Family/patient  expects to be discharged to:: Private residence Living Arrangements: Parent                 Bathroom Shower/Tub: Chief Strategy OfficerTub/shower unit   Bathroom Toilet: Standard     Home Equipment: Grab bars - tub/shower          Prior Functioning/Environment Level of Independence: Independent             OT Diagnosis: Generalized weakness   OT Problem List: Decreased strength;Decreased activity tolerance;Impaired balance (sitting and/or standing)   OT Treatment/Interventions: Self-care/ADL training;DME and/or AE instruction;Patient/family education;Balance training    OT Goals(Current goals can be found in the care plan section) Acute Rehab OT Goals Patient Stated Goal: none stated; agreeable to OT  evaluation OT Goal Formulation: With patient Time For Goal Achievement: 10/03/15 Potential to Achieve Goals: Good ADL Goals Pt Will Transfer to Toilet: with modified independence;ambulating;regular height toilet;bedside commode (vs) Pt Will Perform Tub/Shower Transfer: Tub transfer;with min guard assist;shower seat;ambulating Additional ADL Goal #1: pt will complete ADL with set up/supervision,  sit to stand  OT Frequency: Min 2X/week   Barriers to D/C:            Co-evaluation              End of Session    Activity Tolerance: Patient tolerated treatment well Patient left: in bed;with call bell/phone within reach;with bed alarm set   Time: [id number] OT Time Calculation (min): 22 min Charges:  OT General Charges $OT Visit: 1 Procedure OT Evaluation $Initial OT Evaluation Tier I: 1 Procedure G-Codes:    Joceline Hinchcliff 10/03/2015, 12:55 PM  Marica OtterMaryellen Jasimine Simms, OTR/L [phone] 10/03/2015

## 2015-10-03 NOTE — ED Notes (Signed)
Hospitalist at bedside 

## 2015-10-03 NOTE — Telephone Encounter (Signed)
Call received from Lanier ClamKathy Mahabir, RN CM requesting a hospital follow up appointment and as assessment for the [facility] ( [facility]). The record was reviewed and the patient does not meet criteria for the TCC.  [facility] for 10/17/15 @ 1500 with Dr Venetia NightAmao. The appointment information was placed on the AVS.  Update provided to K. Mahabir, RN CM.

## 2015-10-03 NOTE — Progress Notes (Signed)
PT Cancellation Note  Patient Details Name: Bryan Moyer MRN: [id number] DOB: Dec 24, 1965   Cancelled Treatment:    Reason Eval/Treat Not Completed: Medical issues which prohibited therapy (pt on bedrest until 5:25pm today. Will follow. )   Tamala SerUhlenberg, Ritik Stavola Kistler 10/03/2015, 8:48 AM [phone]

## 2015-10-03 NOTE — ED Provider Notes (Signed)
CSN: [id number]     Arrival date & time 10/02/15  2228 History  By signing my name below, I, Elon Spanner, attest that this documentation has been prepared under the direction and in the presence of Dione Booze, MD. Electronically Signed: Elon Spanner, ED Scribe. 10/03/2015. 2:25 AM.    Chief Complaint  Patient presents with  . Dizziness   The history is provided by the patient and medical records. No language interpreter was used.   HPI Comments: Bryan Moyer is a 49 y.o. male with hx of HLD, HTN who presents to the Emergency Department complaining of minimally worsening dizziness onset several months ago that culminated in a fall yesterday evening after walking 10 yards.  With the exception of the fall, the patient's episode of lightheadedness is a typical episode in that it was provoked by walking a short distance.  The patient denies any new pain but notes some normal to baseline pain in his bilateral feet that was recently diagnosed as neuropathy.  He is unsure of the cause of this diagnosis.  The patient denies unexplained weight loss, SOB.  The patient reports he is a 2 ppd smoker and consumes 6 units of etoh daily.  He denies illicit drug use.   Past Medical History  Diagnosis Date  . Hyperlipemia   . Anxiety   . Depression   . Hypertension    History reviewed. No pertinent past surgical history. Family History  Problem Relation Age of Onset  . Hyperlipidemia Mother   . Hypertension Mother   . Heart disease Father   . Hyperlipidemia Father   . Stroke Father    Social History  Substance Use Topics  . Smoking status: Current Every Day Smoker -- 2.00 packs/day    Types: Cigarettes  . Smokeless tobacco: None  . Alcohol Use: Yes     Comment: Daily. 4-6 Vodka drinks. Last drink: 18:00    Review of Systems  Neurological: Positive for dizziness and syncope.  All other systems reviewed and are negative.   Allergies  Review of patient's allergies indicates no known  allergies.  Home Medications   Prior to Admission medications   Medication Sig Start Date End Date Taking? Authorizing Provider  FLUoxetine (PROZAC) 20 MG tablet Take 1 tablet (20 mg total) by mouth daily. 09/19/15  Yes [name], MD  gabapentin (NEURONTIN) 100 MG capsule Take 1 capsule (100 mg total) by mouth 3 (three) times daily. 09/19/15  Yes [name], MD  Ibuprofen-Diphenhydramine HCl (ADVIL PM) 200-25 MG CAPS Take 2 capsules by mouth at bedtime as needed (sleep/ pain).   Yes Historical Provider, MD  potassium chloride SA (K-DUR,KLOR-CON) 20 MEQ tablet Take 1 tablet (20 mEq total) by mouth daily. 09/20/15  Yes [name], MD   BP 122/81 mmHg  Pulse 98  Temp(Src) 98.5 F (36.9 C) (Oral)  Resp 18  Ht  (1.803 m)  Wt 160 lb (72.576 kg)  BMI 22.33 kg/m2  SpO2 97% Physical Exam  Constitutional: He is oriented to person, place, and time. He appears well-developed and well-nourished. No distress.  HENT:  Head: Normocephalic and atraumatic.  Eyes: EOM are normal. Pupils are equal, round, and reactive to light.  Neck: Neck supple. No JVD present.  Cardiovascular: Normal rate, regular rhythm and normal heart sounds.   No murmur heard. Pulmonary/Chest: Effort normal and breath sounds normal. He has no wheezes. He has no rales. He exhibits no tenderness.  Abdominal: Soft. Bowel sounds are normal. He exhibits no  distension and no mass. There is no tenderness.  Musculoskeletal: Normal range of motion. He exhibits no edema.  Mild generalized muscle wasting.  Finger to nose test showed some tremor but no ataxia. Romberg shows general unsteadiness but no tendency to fall and a specific direction.  Lymphadenopathy:    He has no cervical adenopathy.  Neurological: He is alert and oriented to person, place, and time. No cranial nerve deficit. He exhibits normal muscle tone. Coordination normal.  Tremor present on finger to nose testing but no ataxia.  Romberg is generally  unsteady.  Generalized weakness with strength 4/5 in both arms and both legs.    Skin: Skin is warm and dry. No rash noted.  Psychiatric: He has a normal mood and affect.  Nursing note and vitals reviewed.   ED Course  Procedures (including critical care time)  DIAGNOSTIC STUDIES: Oxygen Saturation is 97% on RA, normal by my interpretation.    COORDINATION OF CARE:  2:22 AM Discussed treatment plan with patient at bedside.  Patient acknowledges and agrees with plan.    Labs Review Results for orders placed or performed during the hospital encounter of 10/02/15  Basic metabolic panel  Result Value Ref Range   Sodium 139 135 - 145 mmol/L   Potassium 3.2 (L) 3.5 - 5.1 mmol/L   Chloride 97 (L) 101 - 111 mmol/L   CO2 29 22 - 32 mmol/L   Glucose, Bld 190 (H) 65 - 99 mg/dL   BUN 6 6 - 20 mg/dL   Creatinine, Ser 1.61 0.61 - 1.24 mg/dL   Calcium 8.8 (L) 8.9 - 10.3 mg/dL   GFR calc non Af Amer >60 >60 mL/min   GFR calc Af Amer >60 >60 mL/min   Anion gap 13 5 - 15  CBC  Result Value Ref Range   WBC 6.6 4.0 - 10.5 K/uL   RBC 3.18 (L) 4.22 - 5.81 MIL/uL   Hemoglobin 13.0 13.0 - 17.0 g/dL   HCT 09.6 (L) 04.5 - 40.9 %   MCV 118.6 (H) 78.0 - 100.0 fL   MCH 40.9 (H) 26.0 - 34.0 pg   MCHC 34.5 30.0 - 36.0 g/dL   RDW 81.1 91.4 - 78.2 %   Platelets 234 150 - 400 K/uL  Urinalysis, Routine w reflex microscopic (not at [facility])  Result Value Ref Range   Color, Urine ORANGE (A) YELLOW   APPearance CLEAR CLEAR   Specific Gravity, Urine 1.017 1.005 - 1.030   pH 8.5 (H) 5.0 - 8.0   Glucose, UA NEGATIVE NEGATIVE mg/dL   Hgb urine dipstick NEGATIVE NEGATIVE   Bilirubin Urine SMALL (A) NEGATIVE   Ketones, ur NEGATIVE NEGATIVE mg/dL   Protein, ur 30 (A) NEGATIVE mg/dL   Nitrite POSITIVE (A) NEGATIVE   Leukocytes, UA SMALL (A) NEGATIVE  Urine microscopic-add on  Result Value Ref Range   Squamous Epithelial / LPF 0-5 (A) NONE SEEN   WBC, UA 0-5 0 - 5 WBC/hpf   RBC / HPF 0-5 0 - 5 RBC/hpf    Bacteria, UA NONE SEEN NONE SEEN  CK  Result Value Ref Range   Total CK 19 (L) 49 - 397 U/L  Hepatic function panel  Result Value Ref Range   Total Protein 6.7 6.5 - 8.1 g/dL   Albumin 2.7 (L) 3.5 - 5.0 g/dL   AST 956 (H) 15 - 41 U/L   ALT 90 (H) 17 - 63 U/L   Alkaline Phosphatase 270 (H) 38 - 126 U/L  Total Bilirubin 2.4 (H) 0.3 - 1.2 mg/dL   Bilirubin, Direct 1.1 (H) 0.1 - 0.5 mg/dL   Indirect Bilirubin 1.3 (H) 0.3 - 0.9 mg/dL  Differential  Result Value Ref Range   Neutrophils Relative % 59 %   Neutro Abs 3.9 1.7 - 7.7 K/uL   Lymphocytes Relative 32 %   Lymphs Abs 2.2 0.7 - 4.0 K/uL   Monocytes Relative 8 %   Monocytes Absolute 0.6 0.1 - 1.0 K/uL   Eosinophils Relative 1 %   Eosinophils Absolute 0.0 0.0 - 0.7 K/uL   Basophils Relative 0 %   Basophils Absolute 0.0 0.0 - 0.1 K/uL   Imaging Review Dg Chest 2 View  10/03/2015  CLINICAL DATA:  49 year old male with dizziness for several months. Cough for several days. Fall this a.m. EXAM: CHEST  2 VIEW COMPARISON:  None. FINDINGS: The heart size and mediastinal contours are within normal limits. Both lungs are clear. The visualized skeletal structures are unremarkable. IMPRESSION: No active cardiopulmonary disease. Electronically Signed   By: Elgie CollardArash  Radparvar M.D.   On: 10/03/2015 03:00   I have personally reviewed and evaluated these images and lab results as part of my medical decision-making.   EKG Interpretation   Date/Time:  Tuesday October 02 2015 22:40:13 EST Ventricular Rate:  107 PR Interval:  128 QRS Duration: 85 QT Interval:  340 QTC Calculation: 454 R Axis:   67 Text Interpretation:  Sinus tachycardia Borderline T wave abnormalities  Baseline wander in lead(s) I II aVR No old tracing to compare Confirmed by  [facility]  MD, Parmvir Boomer ([id number]) on 10/02/2015 10:46:22 PM      MDM   Final diagnoses:  Orthostatic hypotension  Abnormal liver function tests  Macrocytosis    Weakness and dizziness of uncertain  cause. He is noted to have significant muscle wasting. Old records are reviewed and he was seen in urgent care center yesterday and diagnosed with neuropathy. However, my exam shows no definite evidence of neuropathy. Orthostatic vital signs are obtained showing severe drop in blood pressure to 60 systolic when he stands. This is clearly accounts for a large part of his difficulty with walking. Laboratory workup also shows severe macrocytosis. This is suggestive of folate or B12 deficiency and could be related to some neurologic issues as well. Case is discussed with Dr. Clyde LundborgNiu of [facility] hospital[facility] who agrees to admit the patient.  I personally performed the services described in this documentation, which was scribed in my presence. The recorded information has been reviewed and is accurate.      Dione Boozeavid Jerardo Costabile, MD 10/03/15 [phone]

## 2015-10-03 NOTE — Progress Notes (Signed)
[facility]                                                                              Patient Demographics  Bryan Moyer, is a 49 y.o. male, DOB - June 15, 1966, ZOX:[id number]  Admit date - 10/02/2015   Admitting Physician Bryan HarpXilin Niu, MD  Outpatient Primary MD for the patient is No PCP Per Patient  LOS - 0   Chief Complaint  Patient presents with  . Dizziness      HPI on 10/03/2015 by Dr. Lorretta HarpXilin Moyer Bryan Moyer is a 49 y.o. male with PMH of hypertension, hyperlipidemia, depression, anxiety, tobacco abuse, alcohol abuse, who presented with dizziness.  Patient reports that he has been having dizziness for almost 2 months. It is aggravated by position change, such as moving from sitting to standing position or when ambulating. Patient does not have ear ringing, vision change or hearing loss. Not feeling room spinning. He has generalized weakness, but no unilateral weakness, numbness or tingling sensations. He states that he has severe pain in both legs for long time, no leg swelling. He has mild cough, but no chest pain or shortness of breath. Patient does not have abdominal pain or diarrhea. He denies symptoms of UTI. No fever or chills. Patient has been heavily smoking since age of 49. He is heavily drinking alcohol ([place]od[place]), 6 times per day, 7 days a week. He only eats one meal per day.  In ED, patient was found to have positive orthostatic vital signs, WBC 6.6, CK 19, temperature normal, tachycardia, positive urinalysis with small amount of leukocyte and positive nitrate, abnormal liver function with ALP 270, AST 216, ALT 90, total bilirubin 2.4 and direct bilirubin 1.1. Negative chest x-ray for acute abnormalities. Patient is admitted to inpatient for further eval and treatment.  Assessment & Plan  Patient admitted earlier this morning by Dr. Lorretta HarpXilin Moyer.  Please see full H&P for details. Agree with current assessment and plan.  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 -Started on Neurontin  Hyperlipidemia -Lipid panel pending  Essential Hypertension -No medications at this time, as patient was hypotensive upon admission  Abnormal LFTs -Likely secondary to alcohol abuse -Hepatitis panel  Possible urinary tract infection -Patient has no complaints however you A showed positive nitrates, small sites, no bacteria seen, WBC 0-5 -Patient given 1 dose of fosfomycin -Urine culture pending  Malnutrition -Continue supplements, nutrition consulted  Macrocytosis -MCV 118.6 -Vitamin B 10/10/1942, folate 4.2  Tobacco abuse -Continue nicotine patch  Hypokalemia -Potassium 3.2, being replaced -Magnesium level pending  Code Status: Fulll  Family Communication: None at bedside  Disposition Plan: Admitted  Time Spent in minutes   30 minutes  Procedures  None  Consults  None  DVT Prophylaxis  Heparin   Bryan Moyer D.O. on 10/03/2015 at 11:32 AM  Between 7am to 7pm - Pager - [phone]  After 7pm go to [email or web address] - password [email or web address]  And look for the night coverage person covering for me after hours  [facility] Office  [phone]

## 2015-10-03 NOTE — H&P (Signed)
[facility] History and Physical  JAMAL PAVON ZOX:[id number] DOB: [birth year] DOA: 10/02/2015  Referring physician: ED physician PCP: No PCP Per Patient  Specialists:   Chief Complaint: Dizziness and generalized weakness.  HPI: Bryan Moyer is a 49 y.o. male with PMH of hypertension, hyperlipidemia, depression, anxiety, tobacco abuse, alcohol abuse, who presented with dizziness.  Patient reports that he has been having dizziness for almost 2 months. It is aggravated by position change, such as moving from sitting to standing position or when ambulating. Patient does not have ear ringing, vision change or hearing loss. Not feeling room spinning. He has generalized weakness, but no unilateral weakness, numbness or tingling sensations. He states that he has severe pain in both legs for long time, no leg swelling. He has mild cough, but no chest pain or shortness of breath. Patient does not have abdominal pain or diarrhea. He denies symptoms of UTI. No fever or chills. Patient has been heavily smoking since age of 48. He is heavily drinking alcohol ([place]od[place]), 6 times per day, 7 days a week. He only eats one meal per day.  In ED, patient was found to have positive orthostatic vital signs, WBC 6.6, CK 19, temperature normal, tachycardia, positive urinalysis with small amount of leukocyte and positive nitrate, abnormal liver function with ALP 270, AST 216, ALT 90, total bilirubin 2.4 and direct bilirubin 1.1. Negative chest x-ray for acute abnormalities. Patient is admitted to inpatient for further eval and treatment.  Where does patient live?   At home    Can patient participate in ADLs?   Some   Review of Systems:   General: no fevers, chills, no changes in body weight, has poor appetite, has fatigue HEENT: no blurry vision, hearing changes or sore throat Pulm: no dyspnea, coughing, wheezing CV: no chest pain, palpitations Abd: no nausea, vomiting, abdominal pain, diarrhea,  constipation GU: no dysuria, burning on urination, increased urinary frequency, hematuria  Ext: no leg edema Neuro: no unilateral weakness, numbness, or tingling, no vision change or hearing loss. Has dizziness. Skin: no rash MSK: No muscle spasm, no deformity, no limitation of range of movement in spin. Has bilateral leg pain. Heme: No easy bruising.  Travel history: No recent long distant travel.  Allergy: No Known Allergies  Past Medical History  Diagnosis Date  . Hyperlipemia   . Anxiety   . Depression   . Hypertension   . Alcohol abuse   . Tobacco abuse     History reviewed. No pertinent past surgical history.  Social History:  reports that he has been smoking Cigarettes.  He has been smoking about 2.00 packs per day. He does not have any smokeless tobacco history on file. He reports that he drinks alcohol. He reports that he does not use illicit drugs.  Family History:  Family History  Problem Relation Age of Onset  . Hyperlipidemia Mother   . Hypertension Mother   . Heart disease Father   . Hyperlipidemia Father   . Stroke Father      Prior to Admission medications   Medication Sig Start Date End Date Taking? Authorizing Provider  FLUoxetine (PROZAC) 20 MG tablet Take 1 tablet (20 mg total) by mouth daily. 09/19/15  Yes [name], MD  gabapentin (NEURONTIN) 100 MG capsule Take 1 capsule (100 mg total) by mouth 3 (three) times daily. 09/19/15  Yes [name], MD  Ibuprofen-Diphenhydramine HCl (ADVIL PM) 200-25 MG CAPS Take 2 capsules by mouth at bedtime as needed (sleep/ pain).  Yes Historical Provider, MD  potassium chloride SA (K-DUR,KLOR-CON) 20 MEQ tablet Take 1 tablet (20 mEq total) by mouth daily. 09/20/15  Yes Elvina SidleKurt Lauenstein, MD    Physical Exam: Filed Vitals:   10/03/15 0428 10/03/15 0430 10/03/15 0500 10/03/15 0543  BP: 128/86 106/73 125/80 125/80  Pulse: 104 113  113  Temp:      TempSrc:      Resp: 18     Height:      Weight:       SpO2: 96% 100%     General: Not in acute distress, dry mucus and membrane HEENT:       Eyes: PERRL, EOMI, no scleral icterus.       ENT: No discharge from the ears and nose, no pharynx injection, no tonsillar enlargement.        Neck: No JVD, no bruit, no mass felt. Heme: No neck lymph node enlargement. Cardiac: S1/S2, RRR, tachycardia, No murmurs, No gallops or rubs. Pulm:  No rales, wheezing, rhonchi or rubs. Abd: Soft, nondistended, nontender, no rebound pain, no organomegaly, BS present. Ext: No pitting leg edema bilaterally. 2+DP/PT pulse bilaterally. Musculoskeletal: No joint deformities, No joint redness or warmth, no limitation of ROM in spin. Skin: No rashes.  Neuro: Alert, oriented X3, cranial nerves II-XII grossly intact, muscle strength 5/5 in all extremities, sensation to light touch intact. Brachial reflex 2+ bilaterally. Knee reflex 1+ bilaterally. Negative Babinski's sign. Normal finger to nose test. Psych: Patient is not psychotic, no suicidal or hemocidal ideation.  Labs on Admission:  Basic Metabolic Panel:  Recent Labs Lab 10/02/15 2337  NA 139  K 3.2*  CL 97*  CO2 29  GLUCOSE 190*  BUN 6  CREATININE 0.65  CALCIUM 8.8*   Liver Function Tests:  Recent Labs Lab 10/02/15 2335  AST 216*  ALT 90*  ALKPHOS 270*  BILITOT 2.4*  PROT 6.7  ALBUMIN 2.7*   No results for input(s): LIPASE, AMYLASE in the last 168 hours. No results for input(s): AMMONIA in the last 168 hours. CBC:  Recent Labs Lab 10/02/15 2337  WBC 6.6  NEUTROABS 3.9  HGB 13.0  HCT 37.7*  MCV 118.6*  PLT 234   Cardiac Enzymes:  Recent Labs Lab 10/02/15 2335  CKTOTAL 19*    BNP (last 3 results) No results for input(s): BNP in the last 8760 hours.  ProBNP (last 3 results) No results for input(s): PROBNP in the last 8760 hours.  CBG: No results for input(s): GLUCAP in the last 168 hours.  Radiological Exams on Admission: Dg Chest 2 View  10/03/2015  CLINICAL DATA:   49 year old male with dizziness for several months. Cough for several days. Fall this a.m. EXAM: CHEST  2 VIEW COMPARISON:  None. FINDINGS: The heart size and mediastinal contours are within normal limits. Both lungs are clear. The visualized skeletal structures are unremarkable. IMPRESSION: No active cardiopulmonary disease. Electronically Signed   By: Elgie CollardArash  Radparvar M.D.   On: 10/03/2015 03:00    EKG: Independently reviewed. QTC 454, no ischemic change  Assessment/Plan Principal Problem:   Dizziness Active Problems:   Hyperlipemia   Anxiety   Depression   Hypertension   Alcohol abuse   Tobacco abuse   Abnormal LFTs   UTI (lower urinary tract infection)   Orthostatic hypotension   Protein-calorie malnutrition, moderate (HCC)  Dizziness: Most likely due to orthostatic status secondary to dehydration. Patient does not have focal neurologic findings, less likely to have TIA/stroke. No chest pain or shortness  of breath, less like to have cardiac issues. Alcohol abuse may have also contributed.  -will admit to tele bed -IVF: 1L of NS bolus and then 125 cc/h. If not improvement after hydration, may consider MRI-brain to r/o posterior circulation stroke -fall precaution -pt/ot  Depression and anxiety: Stable, no suicidal or homicidal ideations. -Continue home medications: Prozac  Bilateral leg pain: Most likely due to alcoholic peripheral neuropathy. -Neurontin  HLD: Last LDL was not on record. Patient has stopped taking medications for long time. -Check FLP  Hypertension: Not taking medications currently. Blood pressures normal -Observe blood pressure  Abnormal LFTs: Most likely due to alcohol abuse. Need to rule out other possibilities, such as hepatitis. -check hepatitis panel -HIV antibody -UDS  Possible UTI: Patient has positive urinalysis, but denies symptoms. -Fosfomycin x 1 dose -Follow-up urine culture  Protein calorie more  nutrition-moderate: -Ensure  Microcytosis: MCV 118.6, likely due to deficiency of folate and vitamin B12 secondary to alcohol abuse -Check folate and B12 level  Tobacco abuse and Alcohol abuse: -Did counseling about importance of quitting smoking -Nicotine patch -Did counseling about the importance of quitting drinking -CIWA protocol and Folate and Vb1  Hypokalemia: K= 3.2 on admission. - Repleted - Check Mg level   DVT ppx: SQ Heparin       Code Status: Full code Family Communication: None at bed side.   Disposition Plan: Admit to inpatient   Date of Service 10/03/2015    Lorretta Harp [facility] Pager [phone]  If 7PM-7AM, please contact night-coverage [email or web address] Password [email or web address] 10/03/2015, 6:16 AM

## 2015-10-04 DIAGNOSIS — F329 Major depressive disorder, single episode, unspecified: Secondary | ICD-10-CM

## 2015-10-04 DIAGNOSIS — K7030 Alcoholic cirrhosis of liver without ascites: Secondary | ICD-10-CM

## 2015-10-04 DIAGNOSIS — Z720 Tobacco use: Secondary | ICD-10-CM

## 2015-10-04 DIAGNOSIS — F419 Anxiety disorder, unspecified: Secondary | ICD-10-CM

## 2015-10-04 DIAGNOSIS — F1010 Alcohol abuse, uncomplicated: Secondary | ICD-10-CM

## 2015-10-04 DIAGNOSIS — E46 Unspecified protein-calorie malnutrition: Secondary | ICD-10-CM

## 2015-10-04 DIAGNOSIS — K922 Gastrointestinal hemorrhage, unspecified: Secondary | ICD-10-CM

## 2015-10-04 DIAGNOSIS — E785 Hyperlipidemia, unspecified: Secondary | ICD-10-CM

## 2015-10-04 DIAGNOSIS — R7989 Other specified abnormal findings of blood chemistry: Secondary | ICD-10-CM

## 2015-10-04 DIAGNOSIS — D649 Anemia, unspecified: Secondary | ICD-10-CM

## 2015-10-04 DIAGNOSIS — I10 Essential (primary) hypertension: Secondary | ICD-10-CM

## 2015-10-04 DIAGNOSIS — I951 Orthostatic hypotension: Principal | ICD-10-CM

## 2015-10-04 HISTORY — DX: Gastrointestinal hemorrhage, unspecified: K92.2

## 2015-10-04 HISTORY — DX: Anemia, unspecified: D64.9

## 2015-10-04 HISTORY — DX: Unspecified protein-calorie malnutrition: E46

## 2015-10-04 HISTORY — DX: Alcoholic cirrhosis of liver without ascites: K70.30

## 2015-10-04 LAB — LIPID PANEL
Cholesterol: 198 mg/dL (ref 0–200)
Triglycerides: 319 mg/dL — ABNORMAL HIGH (ref <150)
VLDL: 64 mg/dL — AB (ref 0–40)

## 2015-10-04 LAB — HEPATITIS PANEL, ACUTE
HCV Ab: <0.1 s/co ratio (ref 0.0–0.9)
HEP B S AG: NEGATIVE
Hep A IgM: NEGATIVE
Hep B C IgM: NEGATIVE

## 2015-10-04 LAB — URINE CULTURE: Culture: 7000

## 2015-10-04 LAB — COMPREHENSIVE METABOLIC PANEL
ALBUMIN: 2.3 g/dL — AB (ref 3.5–5.0)
ALT: 64 U/L — ABNORMAL HIGH (ref 17–63)
ANION GAP: 5 (ref 5–15)
AST: 136 U/L — ABNORMAL HIGH (ref 15–41)
Alkaline Phosphatase: 200 U/L — ABNORMAL HIGH (ref 38–126)
BILIRUBIN TOTAL: 2.8 mg/dL — AB (ref 0.3–1.2)
BUN: 5 mg/dL — ABNORMAL LOW (ref 6–20)
CALCIUM: 8.1 mg/dL — AB (ref 8.9–10.3)
CO2: 26 mmol/L (ref 22–32)
CREATININE: 0.4 mg/dL — AB (ref 0.61–1.24)
Chloride: 110 mmol/L (ref 101–111)
GFR calc non Af Amer: >60 mL/min (ref >60)
GLUCOSE: 115 mg/dL — AB (ref 65–99)
POTASSIUM: 4.2 mmol/L (ref 3.5–5.1)
SODIUM: 141 mmol/L (ref 135–145)
TOTAL PROTEIN: 5.4 g/dL — AB (ref 6.5–8.1)

## 2015-10-04 LAB — CBC
HCT: 30.2 % — ABNORMAL LOW (ref 39.0–52.0)
Hemoglobin: 10.2 g/dL — ABNORMAL LOW (ref 13.0–17.0)
MCH: 41.1 pg — AB (ref 26.0–34.0)
MCHC: 33.8 g/dL (ref 30.0–36.0)
MCV: 121.8 fL — ABNORMAL HIGH (ref 78.0–100.0)
PLATELETS: 191 10*3/uL (ref 150–400)
RBC: 2.48 MIL/uL — ABNORMAL LOW (ref 4.22–5.81)
RDW: 13.5 % (ref 11.5–15.5)
WBC: 5.3 10*3/uL (ref 4.0–10.5)

## 2015-10-04 LAB — PHOSPHORUS: Phosphorus: 2.7 mg/dL (ref 2.5–4.6)

## 2015-10-04 LAB — GLUCOSE, CAPILLARY: Glucose-Capillary: 106 mg/dL — ABNORMAL HIGH (ref 65–99)

## 2015-10-04 LAB — MAGNESIUM: MAGNESIUM: 1.8 mg/dL (ref 1.7–2.4)

## 2015-10-04 MED ORDER — METOPROLOL TARTRATE 25 MG PO TABS
12.5000 mg | ORAL_TABLET | Freq: Two times a day (BID) | ORAL | Status: DC
Start: 2015-10-04 — End: 2015-10-05
  Administered 2015-10-04 – 2015-10-05 (×2): 12.5 mg via ORAL
  Filled 2015-10-04 (×2): qty 1

## 2015-10-04 MED ORDER — ATORVASTATIN CALCIUM 10 MG PO TABS
10.0000 mg | ORAL_TABLET | Freq: Every day | ORAL | Status: DC
  Administered 2015-10-04: 10 mg via ORAL
  Filled 2015-10-04 (×2): qty 1

## 2015-10-04 MED ORDER — BOOST / RESOURCE BREEZE PO LIQD: 1.0000 | Freq: Every morning | ORAL | Status: DC

## 2015-10-04 NOTE — Progress Notes (Signed)
[facility]                                                                              Patient Demographics  Bryan Moyer, is a 49 y.o. male, DOB - 1966/03/27, ZOX:[id number]  Admit date - 10/02/2015   Admitting Physician Lorretta HarpXilin Niu, MD  Outpatient Primary MD for the patient is No PCP Per Patient  LOS - 1   Chief Complaint  Patient presents with  . Dizziness     HPI on 10/03/2015 by Dr. Lorretta HarpXilin Niu Bryan InchesRodney C Moyer is a 49 y.o. male with PMH of hypertension, hyperlipidemia, depression, anxiety, tobacco abuse, alcohol abuse, who presented with dizziness.  Patient reports that he has been having dizziness for almost 2 months. It is aggravated by position change, such as moving from sitting to standing position or when ambulating. Patient does not have ear ringing, vision change or hearing loss. Not feeling room spinning. He has generalized weakness, but no unilateral weakness, numbness or tingling sensations. He states that he has severe pain in both legs for long time, no leg swelling. He has mild cough, but no chest pain or shortness of breath. Patient does not have abdominal pain or diarrhea. He denies symptoms of UTI. No fever or chills. Patient has been heavily smoking since age of 49. He is heavily drinking alcohol ([place]), 6 times per day, 7 days a week. He only eats one meal per day.  In ED, patient was found to have positive orthostatic vital signs, WBC 6.6, CK 19, temperature normal, tachycardia, positive urinalysis with small amount of leukocyte and positive nitrate, abnormal liver function with ALP 270, AST 216, ALT 90, total bilirubin 2.4 and direct bilirubin 1.1. Negative chest x-ray for acute abnormalities. Patient is admitted to inpatient for further eval and treatment. 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 -Continue Neurontin -Pending PT  Hyperlipidemia -Lipid panel: TC 198, TG 319, HDL <10, LDL not caculated  -Will start patient on statin  Essential Hypertension -No medications at this time, as patient was hypotensive upon admission  Abnormal LFTs -Improving, Likely secondary to alcohol abuse -Hepatitis panel unremarkable  Possible urinary tract infection -Patient has no complaints however you A showed positive nitrates, small sites, no bacteria seen, WBC 0-5 -Patient given 1 dose of fosfomycin -Urine culture pending  Malnutrition -Continue supplements, nutrition consulted  Macrocytosis -MCV 118.6 -Vitamin B 10/10/1942, folate 4.2- continue folate   Tobacco abuse -Continue nicotine patch  Hypokalemia -Potassium 4.2 -Magnesium 1.8, Phos 2.7 -Continue to monitor   Code Status: Fulll  Family Communication: None at bedside  Disposition Plan: Admitted. Pending PT eval  Time Spent in minutes 30 minutes  Procedures  None  Consults  None  DVT Prophylaxis  Heparin  Lab Results  Component Value Date   PLT 191 10/04/2015    Medications  Scheduled Meds: . feeding supplement  1 Container Oral q morning - 10a  . FLUoxetine  20 mg Oral Daily  . folic acid  1 mg Oral Daily  . gabapentin  100 mg Oral TID  . guaiFENesin  600 mg Oral BID  . heparin  5,000 Units Subcutaneous 3 times per day  . LORazepam  0-4 mg Intravenous Q6H   Followed by  . [START ON 10/05/2015] LORazepam  0-4 mg Intravenous Q12H  . multivitamin with minerals  1 tablet Oral Daily  . nicotine  21 mg  Transdermal Daily  . sodium chloride  3 mL Intravenous Q12H  . thiamine  100 mg Oral Daily   Or  . thiamine  100 mg Intravenous Daily   Continuous Infusions: . sodium chloride 125 mL/hr at 10/03/15 2315   PRN Meds:.LORazepam **OR** LORazepam, ondansetron **OR** ondansetron (ZOFRAN) IV  Antibiotics    Anti-infectives    None      Subjective:   Harinder Romas seen and examined today.  Patient denies further dizziness.  Denies chest pain, shortness of breath, abdominal pain, nausea, vomiting.  Objective:   Filed Vitals:   10/03/15 0615 10/03/15 1526 10/03/15 2052 10/04/15 0512  BP: 116/90 124/79 145/94 131/91  Pulse: 89 104 101 102  Temp: 98.3 F (36.8 C) 98.1 F (36.7 C) 98.5 F (36.9 C) 98 F (36.7 C)  TempSrc: Oral Oral Oral Oral  Resp: Height:  (1.803 m)     Weight: 70 kg (154 lb 5.2 oz)     SpO2: 99% 96% 100% 96%    Wt Readings from Last 3 Encounters:  10/03/15 70 kg (154 lb 5.2 oz)  09/19/15 69.4 kg (153 lb)  03/22/14 68.04 kg (150 lb)     Intake/Output Summary (Last 24 hours) at 10/04/15 1335 Last data filed at 10/04/15 0941  Gross per 24 hour  Intake   4245 ml  Output   1605 ml  Net   2640 ml    Exam  General: Well developed, well nourished, NAD, appears stated age  HEENT: NCAT,  mucous membranes moist.   Cardiovascular: S1 S2 auscultated, no rubs, murmurs or gallops. Tachycardic  Respiratory: Clear to auscultation bilaterally  Abdomen: Soft, nontender, nondistended, + bowel sounds  Extremities: warm dry without cyanosis clubbing or edema  Neuro: AAOx3, nonfocal  Skin: Without rashes exudates or nodules  Psych: Normal affect and demeanor   Data Review   Micro Results Recent Results (from the past 240 hour(s))  Urine culture     Status: None   Collection Time: 10/02/15 11:06 PM  Result Value Ref Range Status   Specimen Description URINE, CLEAN CATCH  Final   Special Requests NONE  Final   Culture   Final     7,000 COLONIES/mL INSIGNIFICANT GROWTH Performed at [facility]    Report Status 10/04/2015 FINAL  Final    Radiology Reports Dg Chest 2 View  10/03/2015  CLINICAL DATA:  49 year old male with dizziness for several months. Cough for several days. Fall this a.m. EXAM: CHEST  2 VIEW COMPARISON:  None. FINDINGS: The heart size and mediastinal contours are within normal limits. Both lungs are clear. The visualized skeletal structures are unremarkable. IMPRESSION: No active cardiopulmonary disease. Electronically Signed   By: Elgie Collard M.D.   On: 10/03/2015 03:00    CBC  Recent Labs  Lab 10/02/15 2337 10/04/15 0527  WBC 6.6 5.3  HGB 13.0 10.2*  HCT 37.7* 30.2*  PLT 234 191  MCV 118.6* 121.8*  MCH 40.9* 41.1*  MCHC 34.5 33.8  RDW 13.8 13.5  LYMPHSABS 2.2  --   MONOABS 0.6  --   EOSABS 0.0  --   BASOSABS 0.0  --     Chemistries   Recent Labs Lab 10/02/15 2335 10/02/15 2337 10/04/15 0527  NA  --  139 141  K  --  3.2* 4.2  CL  --  97* 110  CO2  --  29 26  GLUCOSE  --  190* 115*  BUN  --  6 5*  CREATININE  --  0.65 0.40*  CALCIUM  --  8.8* 8.1*  MG  --   --  1.8  AST 216*  --  136*  ALT 90*  --  64*  ALKPHOS 270*  --  200*  BILITOT 2.4*  --  2.8*   ------------------------------------------------------------------------------------------------------------------ estimated creatinine clearance is 108.2 mL/min (by C-G formula based on Cr of 0.4). ------------------------------------------------------------------------------------------------------------------ No results for input(s): HGBA1C in the last 72 hours. ------------------------------------------------------------------------------------------------------------------  Recent Labs  10/04/15 0527  CHOL 198  HDL <10*  LDLCALC NOT CALCULATED  TRIG 319*  CHOLHDL NOT CALCULATED   ------------------------------------------------------------------------------------------------------------------ No  results for input(s): TSH, T4TOTAL, T3FREE, THYROIDAB in the last 72 hours.  Invalid input(s): FREET3 ------------------------------------------------------------------------------------------------------------------  Recent Labs  10/03/15 0256  VITAMINB12 843  FOLATE 4.2*    Coagulation profile  Recent Labs Lab 10/03/15 0256  INR 0.95    No results for input(s): DDIMER in the last 72 hours.  Cardiac Enzymes No results for input(s): CKMB, TROPONINI, MYOGLOBIN in the last 168 hours.  Invalid input(s): CK ------------------------------------------------------------------------------------------------------------------ Invalid input(s): POCBNP    Anali Cabanilla D.O. on 10/04/2015 at 1:35 PM  Between 7am to 7pm - Pager - [phone]  After 7pm go to [email or web address] - password [email or web address]  And look for the night coverage person covering for me after hours  [facility] Office  [phone]

## 2015-10-04 NOTE — Progress Notes (Signed)
Occupational Therapy Treatment Patient Details Name: Bryan Moyer MRN: [id number] DOB: 1965-12-31 Today's Date: 10/04/2015    History of present illness pt was admitted for dizziness.  He has a PMH significant for HTN, anxiety, depression, ETOH, peripheral neuropathy   OT comments  Pt mostly min guard for safety:  Has peripheral neuropathy, slight tremors but no LOB.  Follow Up Recommendations  SNF;Supervision/Assistance - 24 hour    Equipment Recommendations  Other (comment) (pt has a grab bar in the tub)    Recommendations for Other Services      Precautions / Restrictions Precautions Precautions: Fall Restrictions Weight Bearing Restrictions: No       Mobility Bed Mobility         Supine to sit: Supervision     General bed mobility comments: moves quickly  Transfers     Transfers: Sit to/from Stand Sit to Stand: Min guard         General transfer comment: no LOB, slightly unsteady but no LOB    Balance Overall balance assessment: History of Falls                                 ADL       Grooming: Wash/dry hands;Supervision/safety;Standing                   Toilet Transfer: Min guard;Ambulation;Comfort height toilet (used IV for support)             General ADL Comments: simulated tub transfer with min guard Assist.  Pt has a grab bar at far end of tub.  Pt able to retrieve item from floor with min guard.  No LOB however pt was a little shaky during session      Vision                     Perception     Praxis      Cognition   Behavior During Therapy: WFL for tasks assessed/performed Overall Cognitive Status: Within Functional Limits for tasks assessed                       Extremity/Trunk Assessment               Exercises     Shoulder Instructions       General Comments      Pertinent Vitals/ Pain       Pain Assessment: No/denies pain  Home Living                                          Prior Functioning/Environment              Frequency Min 2X/week     Progress Toward Goals  OT Goals(current goals can now be found in the care plan section)  Progress towards OT goals: Progressing toward goals     Plan      Co-evaluation                 End of Session     Activity Tolerance Patient tolerated treatment well   Patient Left in bed;with call bell/phone within reach;with bed alarm set;with family/visitor present   Nurse Communication          Time: 5621-3086 OT Time Calculation (min): 18  min  Charges: OT General Charges $OT Visit: 1 Procedure OT Treatments $Self Care/Home Management : 8-22 mins  Shakisha Abend 10/04/2015, 2:23 PM   Marica OtterMaryellen Wissam Resor, OTR/L [phone] 10/04/2015

## 2015-10-04 NOTE — Evaluation (Signed)
Physical Therapy Evaluation Patient Details Name: Bryan Moyer MRN: [id number] DOB: [birth year] Today's Date: 10/04/2015   History of Present Illness  pt was admitted for dizziness.  He has a PMH significant for HTN, anxiety, depression, ETOH, peripheral neuropathy  Clinical Impression  On eval, pt was supervision level assist for mobility-walked ~400 feet. Pt reports mild lightheadedness/dizziness. Pt tolerated distance well. No LOB. BP during session: supine-149/101, sitting-151/109, standing-117/85, after ambulation-148/105. Made RN aware of BP readings. Still note some shakiness. Do not feel pt has any follow up PT needs. Recommend 24 hour supervision initially for a few days. Sister present during session-states family can provide this.     Follow Up Recommendations Supervision/Assistance - 24 hour (initially for a few days)    Equipment Recommendations  None recommended by PT    Recommendations for Other Services       Precautions / Restrictions Precautions Precautions: Fall Restrictions Weight Bearing Restrictions: No      Mobility  Bed Mobility Overal bed mobility: Modified Independent              Transfers Overall transfer level: Modified independent   Transfers: Sit to/from Stand            Ambulation/Gait Ambulation/Gait assistance: Supervision Ambulation Distance (Feet): 400 Feet Assistive device: None Gait Pattern/deviations: Step-through pattern     General Gait Details: Pt reports mild lightheadedness/dizziness. No LOB. Pt tolerated distance well  Stairs            Wheelchair Mobility    Modified Rankin (Stroke Patients Only)       Balance Overall balance assessment: No apparent balance deficits (not formally assessed)                                           Pertinent Vitals/Pain Pain Assessment: 0-10 Pain Score: 5  Pain Location: discomfort bil feet from neuropathy Pain Intervention(s): Monitored  during session    Home Living Family/patient expects to be discharged to:: Private residence   Available Help at Discharge: Family;Available 24 hours/day   Home Access: Stairs to enter Entrance Stairs-Rails: None Entrance Stairs-Number of Steps: 3          Prior Function Level of Independence: Independent               Hand Dominance        Extremity/Trunk Assessment   Upper Extremity Assessment: Defer to OT evaluation           Lower Extremity Assessment: Generalized weakness      Cervical / Trunk Assessment: Normal  Communication   Communication: No difficulties  Cognition Arousal/Alertness: Awake/alert Behavior During Therapy: WFL for tasks assessed/performed Overall Cognitive Status: Within Functional Limits for tasks assessed                      General Comments      Exercises        Assessment/Plan    PT Assessment Patent does not need any further PT services  PT Diagnosis     PT Problem List    PT Treatment Interventions     PT Goals (Current goals can be found in the Care Plan section) Acute Rehab PT Goals Patient Stated Goal: none stated; agreeable to PT evaluation PT Goal Formulation: All assessment and education complete, DC therapy    Frequency     Barriers  to discharge        Co-evaluation               End of Session Equipment Utilized During Treatment: Gait belt Activity Tolerance: Patient tolerated treatment well Patient left: in bed;with call bell/phone within reach;with family/visitor present;with bed alarm set           Time: 4098-1191 PT Time Calculation (min) (ACUTE ONLY): 25 min   Charges:   PT Evaluation $Initial PT Evaluation Tier I: 1 Procedure PT Treatments $Gait Training: 8-22 mins   PT G Codes:        Rebeca Alert, MPT Pager: [phone]

## 2015-10-04 NOTE — Progress Notes (Signed)
Initial Nutrition Assessment  DOCUMENTATION CODES:   Non-severe (moderate) malnutrition in context of social or environmental circumstances  INTERVENTION:  - Will d/c Ensure Enlive per pt request - Will order Boost Breeze once/day, this supplement provides 290 kcal and 9 grams of protein; will increase frequency if pt tolerates this supplement well - Monitor magnesium, potassium, and phosphorus daily for at least 3 days, MD to replete as needed, as pt is at risk for refeeding syndrome given heavy alcohol use PTA and poor intakes for >/=3 months. - RD will continue to monitor for needs  NUTRITION DIAGNOSIS:   Inadequate oral intake related to poor appetite as evidenced by per patient/family report.  GOAL:   Patient will meet greater than or equal to 90% of their needs  MONITOR:   PO intake, Supplement acceptance, Weight trends, Labs, Skin, I & O's  REASON FOR ASSESSMENT:   Consult Assessment of nutrition requirement/status  ASSESSMENT:   49 y.o. male with PMH of hypertension, hyperlipidemia, depression, anxiety, tobacco abuse, alcohol abuse, who presented with dizziness.  Pt seen for consult. BMI indicates normal weight status. Pt states he typically only eats 1 meal per day and that this pattern has been ongoing "for a long time." He sometimes snacks during the day but mainly only eats once/day. Pt denies any nausea or pain after eating 100% of pot roast for lunch today. PTA the only item that caused nausea with intakes was Ensure, which his mother had purchased for him; will d/c Ensure and order Boost Breeze.  Pt states UBW of 160 lbs and that he has been stable at this weight for at least several months. Current weight indicates pt has lost 6 lbs (4% body weight) in an unknown time frame. Moderate muscle and fat wasting noted to upper body and lower body was not assessed at this time.   Pt is at risk for refeeding due to heavy alcohol usage and poor intakes PTA. Not meeting  needs on average based on reported intakes PTA. Medications reviewed. Labs reviewed; CBGs: 106 and 140 mg/dL, BUN/creatinine low, Ca: 8.1 mg/dL, LFTs elevated.   Diet Order:  Diet Heart Room service appropriate?: Yes; Fluid consistency:: Thin  Skin:  Reviewed, no issues  Last BM:  11/30  Height:   Ht Readings from Last 1 Encounters:  10/03/15 5\' 11"  (1.803 m)    Weight:   Wt Readings from Last 1 Encounters:  10/03/15 154 lb 5.2 oz (70 kg)    Ideal Body Weight:  78.18 kg (kg)  BMI:  Body mass index is 21.53 kg/(m^2).  Estimated Nutritional Needs:   Kcal:  1750-1950  Protein:  70-80 grams  Fluid:  >/= 2 L/day  EDUCATION NEEDS:   No education needs identified at this time      Trenton GammonJessica Jodette Wik, RD, LDN Inpatient Clinical Dietitian Pager # [phone] After hours/weekend pager # [phone]

## 2015-10-05 DIAGNOSIS — E440 Moderate protein-calorie malnutrition: Secondary | ICD-10-CM

## 2015-10-05 DIAGNOSIS — N390 Urinary tract infection, site not specified: Secondary | ICD-10-CM

## 2015-10-05 LAB — COMPREHENSIVE METABOLIC PANEL
ALK PHOS: 209 U/L — AB (ref 38–126)
ALT: 60 U/L (ref 17–63)
AST: 126 U/L — AB (ref 15–41)
Albumin: 2.6 g/dL — ABNORMAL LOW (ref 3.5–5.0)
Anion gap: 8 (ref 5–15)
BILIRUBIN TOTAL: 2.5 mg/dL — AB (ref 0.3–1.2)
CALCIUM: 7.9 mg/dL — AB (ref 8.9–10.3)
CO2: 19 mmol/L — ABNORMAL LOW (ref 22–32)
CREATININE: 0.36 mg/dL — AB (ref 0.61–1.24)
Chloride: 108 mmol/L (ref 101–111)
Glucose, Bld: 109 mg/dL — ABNORMAL HIGH (ref 65–99)
Potassium: 3.7 mmol/L (ref 3.5–5.1)
Sodium: 135 mmol/L (ref 135–145)
TOTAL PROTEIN: 6.1 g/dL — AB (ref 6.5–8.1)

## 2015-10-05 LAB — HEMOGLOBIN A1C
Hgb A1c MFr Bld: 4.9 % (ref 4.8–5.6)
MEAN PLASMA GLUCOSE: 94 mg/dL

## 2015-10-05 LAB — MAGNESIUM: MAGNESIUM: 1.8 mg/dL (ref 1.7–2.4)

## 2015-10-05 LAB — CBC
HEMATOCRIT: 32.8 % — AB (ref 39.0–52.0)
HEMOGLOBIN: 11 g/dL — AB (ref 13.0–17.0)
MCH: 40 pg — AB (ref 26.0–34.0)
MCHC: 33.5 g/dL (ref 30.0–36.0)
MCV: 119.3 fL — AB (ref 78.0–100.0)
PLATELETS: 165 10*3/uL (ref 150–400)
RBC: 2.75 MIL/uL — AB (ref 4.22–5.81)
RDW: 13.1 % (ref 11.5–15.5)
WBC: 6.2 10*3/uL (ref 4.0–10.5)

## 2015-10-05 LAB — PHOSPHORUS: PHOSPHORUS: 2.1 mg/dL — AB (ref 2.5–4.6)

## 2015-10-05 LAB — GLUCOSE, CAPILLARY: Glucose-Capillary: 106 mg/dL — ABNORMAL HIGH (ref 65–99)

## 2015-10-05 MED ORDER — BOOST / RESOURCE BREEZE PO LIQD: 1.0000 | Freq: Every morning | ORAL | Status: AC

## 2015-10-05 MED ORDER — FOLIC ACID 1 MG PO TABS: 1.0000 mg | ORAL_TABLET | Freq: Every day | ORAL | Status: AC

## 2015-10-05 MED ORDER — GABAPENTIN 100 MG PO CAPS: 100.0000 mg | ORAL_CAPSULE | Freq: Three times a day (TID) | ORAL | Status: DC

## 2015-10-05 MED ORDER — NICOTINE 21 MG/24HR TD PT24: 21.0000 mg | MEDICATED_PATCH | Freq: Every day | TRANSDERMAL | Status: AC

## 2015-10-05 MED ORDER — THIAMINE HCL 100 MG PO TABS: 100.0000 mg | ORAL_TABLET | Freq: Every day | ORAL | Status: AC

## 2015-10-05 MED ORDER — ADULT MULTIVITAMIN W/MINERALS CH: 1.0000 | ORAL_TABLET | Freq: Every day | ORAL | Status: AC

## 2015-10-05 MED ORDER — ATORVASTATIN CALCIUM 10 MG PO TABS: 10.0000 mg | ORAL_TABLET | Freq: Every day | ORAL | Status: AC

## 2015-10-05 MED ORDER — METOPROLOL TARTRATE 25 MG PO TABS: 12.5000 mg | ORAL_TABLET | Freq: Two times a day (BID) | ORAL | Status: AC

## 2015-10-05 NOTE — Discharge Summary (Signed)
Physician Discharge Summary  Bryan Moyer YNW:[id number] DOB: 10-21-1966 DOA: 10/02/2015  PCP: No PCP Per Patient  Admit date: 10/02/2015 Discharge date: 10/05/2015  Time spent: 45 minutes  Recommendations for Outpatient Follow-up:  Patient will be discharged to home.  Patient will need to follow up with primary care provider within one week of discharge and discuss blood pressure and cholesterol and repeat CBC and CMP. Patient has an appointment on 10/07/2015 with [facility].  Patient should continue medications as prescribed.  Patient should follow a heart healthy diet. Continue activity as tolerated. Patient advised to stop smoking and drinking alcohol.  Discharge Diagnoses:  Principal Problem:   Dizziness Active Problems:   Hyperlipemia   Anxiety   Depression   Hypertension   Alcohol abuse   Tobacco abuse   Abnormal LFTs   UTI (lower urinary tract infection)   Orthostatic hypotension   Protein-calorie malnutrition, moderate (HCC)   Discharge Condition: Stable Diet recommendation: Heart healthy  Filed Weights   10/02/15 2241 10/03/15 0615  Weight: 72.576 kg (160 lb) 70 kg (154 lb 5.2 oz)    History of present illness:  on 10/03/2015 by Dr. Jill Side is a 49 y.o. male with PMH of hypertension, hyperlipidemia, depression, anxiety, tobacco abuse, alcohol abuse, who presented with dizziness.  Patient reports that he has been having dizziness for almost 2 months. It is aggravated by position change, such as moving from sitting to standing position or when ambulating. Patient does not have ear ringing, vision change or hearing loss. Not feeling room spinning. He has generalized weakness, but no unilateral weakness, numbness or tingling sensations. He states that he has severe pain in both legs for long time, no leg swelling. He has mild cough, but no chest pain or shortness of breath. Patient does not have abdominal pain or diarrhea. He  denies symptoms of UTI. No fever or chills. Patient has been heavily smoking since age of 22. He is heavily drinking alcohol ([place]), 6 times per day, 7 days a week. He only eats one meal per day.  In ED, patient was found to have positive orthostatic vital signs, WBC 6.6, CK 19, temperature normal, tachycardia, positive urinalysis with small amount of leukocyte and positive nitrate, abnormal liver function with ALP 270, AST 216, ALT 90, total bilirubin 2.4 and direct bilirubin 1.1. Negative chest x-ray for acute abnormalities. Patient is admitted to inpatient for further eval and treatment.  Hospital Course:  Dizziness with orthostatic hypotension -Upon admission, patient was noted to be orthostatic which is likely secondary to dehydration -Patient does admit to alcohol abuse -Patient had no neurological deficits upon examination -Improving -PT and OT recommended close supervision for next few days -Patient and family stated this can be done at home  Depression/anxiety -No suicidal or homicidal ideations at this time -Continue Prozac  Alcohol abuse -Currently no signs of withdrawal -Patient states he drinks 7 drinks per day of vodka -Was placed on CIWA protocol -Continue multivitamin, thiamine, folate  Bilateral leg pain -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 -Continue Neurontin  Hyperlipidemia -Lipid panel: TC 198, TG 319, HDL <10, LDL not caculated  -Continue statin -Patient will need follow-up with his PCP regarding this  Essential Hypertension -No medications at this time, as patient was hypotensive upon admission  Abnormal LFTs -Improving, Likely secondary to alcohol abuse -Hepatitis panel unremarkable  Possible urinary tract infection -Patient has no complaints however you A showed positive nitrates, small sites, no bacteria seen, WBC  0-5 -Patient given 1 dose of fosfomycin -Urine culture 7000 colonies, insignificant growth  Malnutrition -Continue  supplements, nutrition consulted  Macrocytosis -MCV 118.6 -Vitamin B 10/10[birth year]1942, folate 4.2- continue folate   Tobacco abuse -Continue nicotine patch  Hypokalemia -Potassium 4.2 -Magnesium 1.8, Phos 2.7  Procedures  None  Consults  None  Discharge Exam: Filed Vitals:   10/04/15 2213 10/05/15 0535  BP: 150/98 142/98  Pulse: 91 102  Temp: 98.3 F (36.8 C) 98 F (36.7 C)  Resp: 18 18   Exam  General: Well developed, well nourished, NAD  HEENT: NCAT, mucous membranes moist.   Cardiovascular: S1 S2 auscultated, no rubs, murmurs or gallops.RRR  Respiratory: Clear to auscultation bilaterally  Abdomen: Soft, nontender, nondistended, + bowel sounds  Extremities: warm dry without cyanosis clubbing or edema  Neuro: AAOx3, nonfocal  Psych: Normal affect and demeanor  Discharge Instructions  Discharge Instructions    Discharge instructions    Complete by:  As directed   Patient will be discharged to home.  Patient will need to follow up with primary care provider within one week of discharge and discuss blood pressure and cholesterol and repeat CBC and CMP.  Patient should continue medications as prescribed.  Patient should follow a heart healthy diet. Continue activity as tolerated.            Medication List    STOP taking these medications        potassium chloride SA 20 MEQ tablet  Commonly known as:  K-DUR,KLOR-CON      TAKE these medications        ADVIL PM 200-25 MG Caps  Generic drug:  Ibuprofen-Diphenhydramine HCl  Take 2 capsules by mouth at bedtime as needed (sleep/ pain).     atorvastatin 10 MG tablet  Commonly known as:  LIPITOR  Take 1 tablet (10 mg total) by mouth daily at 6 PM.     feeding supplement Liqd  Take 1 Container by mouth every morning.     FLUoxetine 20 MG tablet  Commonly known as:  PROZAC  Take 1 tablet (20 mg total) by mouth daily.     folic acid 1 MG tablet  Commonly known as:  FOLVITE  Take 1 tablet (1 mg  total) by mouth daily.     gabapentin 100 MG capsule  Commonly known as:  NEURONTIN  Take 1 capsule (100 mg total) by mouth 3 (three) times daily.     metoprolol tartrate 25 MG tablet  Commonly known as:  LOPRESSOR  Take 0.5 tablets (12.5 mg total) by mouth 2 (two) times daily.     multivitamin with minerals Tabs tablet  Take 1 tablet by mouth daily.     nicotine 21 mg/24hr patch  Commonly known as:  NICODERM CQ - dosed in mg/24 hours  Place 1 patch (21 mg total) onto the skin daily.     thiamine 100 MG tablet  Take 1 tablet (100 mg total) by mouth daily.       No Known Allergies     Follow-up Information    [facility] Go on 10/17/2015.   Specialty:  Internal Medicine   Why:  at 3:00pm for a hospital follow up appointment with Dr Carleene OverlieAmao   Contact information:   [address] [id number] [place] [id number] [phone]       The results of significant diagnostics from this hospitalization (including imaging, microbiology, ancillary and laboratory) are listed below for  reference.    Significant Diagnostic Studies: Dg Chest 2 View  10/03/2015  CLINICAL DATA:  49 year old male with dizziness for several months. Cough for several days. Fall this a.m. EXAM: CHEST  2 VIEW COMPARISON:  None. FINDINGS: The heart size and mediastinal contours are within normal limits. Both lungs are clear. The visualized skeletal structures are unremarkable. IMPRESSION: No active cardiopulmonary disease. Electronically Signed   By: Elgie Collard M.D.   On: 10/03/2015 03:00    Microbiology: Recent Results (from the past 240 hour(s))  Urine culture     Status: None   Collection Time: 10/02/15 11:06 PM  Result Value Ref Range Status   Specimen Description URINE, CLEAN CATCH  Final   Special Requests NONE  Final   Culture   Final    7,000 COLONIES/mL INSIGNIFICANT GROWTH Performed at [facility]    Report Status  10/04/2015 FINAL  Final     Labs: Basic Metabolic Panel:  Recent Labs Lab 10/02/15 2337 10/04/15 0527 10/05/15 0548  NA 139 141 135  K 3.2* 4.2 3.7  CL 97* 110 108  CO2 29 26 19*  GLUCOSE 190* 115* 109*  BUN 6 5* <5*  CREATININE 0.65 0.40* 0.36*  CALCIUM 8.8* 8.1* 7.9*  MG  --  1.8 1.8  PHOS  --  2.7 2.1*   Liver Function Tests:  Recent Labs Lab 10/02/15 2335 10/04/15 0527 10/05/15 0548  AST 216* 136* 126*  ALT 90* 64* 60  ALKPHOS 270* 200* 209*  BILITOT 2.4* 2.8* 2.5*  PROT 6.7 5.4* 6.1*  ALBUMIN 2.7* 2.3* 2.6*   No results for input(s): LIPASE, AMYLASE in the last 168 hours. No results for input(s): AMMONIA in the last 168 hours. CBC:  Recent Labs Lab 10/02/15 2337 10/04/15 0527 10/05/15 0548  WBC 6.6 5.3 6.2  NEUTROABS 3.9  --   --   HGB 13.0 10.2* 11.0*  HCT 37.7* 30.2* 32.8*  MCV 118.6* 121.8* 119.3*  PLT 234 191 165   Cardiac Enzymes:  Recent Labs Lab 10/02/15 2335  CKTOTAL 19*   BNP: BNP (last 3 results) No results for input(s): BNP in the last 8760 hours.  ProBNP (last 3 results) No results for input(s): PROBNP in the last 8760 hours.  CBG:  Recent Labs Lab 10/03/15 0751 10/04/15 0737 10/05/15 0738  GLUCAP 140* 106* 106*       Signed:  [name]  [facility] 10/05/2015, 9:47 AM

## 2015-10-05 NOTE — Care Management Note (Signed)
Case Management Note  Patient Details  Name: Bryan Moyer MRN: [id number] Date of Birth: [birth year]  Subjective/Objective:                    Action/Plan:d/c home no further d/c needs or orders.   Expected Discharge Date:                  Expected Discharge Plan:  Home/Self Care  In-House Referral:     Discharge planning Services  CM Consult, [facility]  Post Acute Care Choice:    Choice offered to:     DME Arranged:    DME Agency:     HH Arranged:    HH Agency:     Status of Service:  Completed, signed off  Medicare Important Message Given:    Date Medicare IM Given:    Medicare IM give by:    Date Additional Medicare IM Given:    Additional Medicare Important Message give by:     If discussed at Long Length of Stay Meetings, dates discussed:    Additional Comments:  Lanier ClamMahabir, Yug Loria, RN 10/05/2015, 12:45 PM

## 2015-10-05 NOTE — Discharge Instructions (Signed)

## 2015-10-17 ENCOUNTER — Inpatient Hospital Stay: Payer: Self-pay | Admitting: Family Medicine

## 2015-10-19 ENCOUNTER — Inpatient Hospital Stay: Payer: Self-pay

## 2015-10-30 ENCOUNTER — Encounter (HOSPITAL_COMMUNITY): Payer: Self-pay | Admitting: Emergency Medicine

## 2015-10-30 ENCOUNTER — Inpatient Hospital Stay (HOSPITAL_COMMUNITY)
Admission: EM | Admit: 2015-10-30 | Discharge: 2015-11-03 | DRG: 378 | Disposition: A | Discharge status: Home / Self Care | Payer: Self-pay | Attending: Internal Medicine | Admitting: Internal Medicine

## 2015-10-30 DIAGNOSIS — D6959 Other secondary thrombocytopenia: Secondary | ICD-10-CM | POA: Diagnosis present

## 2015-10-30 DIAGNOSIS — D7589 Other specified diseases of blood and blood-forming organs: Secondary | ICD-10-CM | POA: Diagnosis present

## 2015-10-30 DIAGNOSIS — T39395A Adverse effect of other nonsteroidal anti-inflammatory drugs [NSAID], initial encounter: Secondary | ICD-10-CM | POA: Diagnosis present

## 2015-10-30 DIAGNOSIS — R945 Abnormal results of liver function studies: Secondary | ICD-10-CM

## 2015-10-30 DIAGNOSIS — D62 Acute posthemorrhagic anemia: Secondary | ICD-10-CM | POA: Diagnosis present

## 2015-10-30 DIAGNOSIS — K760 Fatty (change of) liver, not elsewhere classified: Secondary | ICD-10-CM | POA: Diagnosis present

## 2015-10-30 DIAGNOSIS — K259 Gastric ulcer, unspecified as acute or chronic, without hemorrhage or perforation: Secondary | ICD-10-CM | POA: Diagnosis present

## 2015-10-30 DIAGNOSIS — D689 Coagulation defect, unspecified: Secondary | ICD-10-CM | POA: Diagnosis present

## 2015-10-30 DIAGNOSIS — K922 Gastrointestinal hemorrhage, unspecified: Secondary | ICD-10-CM | POA: Diagnosis present

## 2015-10-30 DIAGNOSIS — E440 Moderate protein-calorie malnutrition: Secondary | ICD-10-CM | POA: Diagnosis present

## 2015-10-30 DIAGNOSIS — R7989 Other specified abnormal findings of blood chemistry: Secondary | ICD-10-CM | POA: Diagnosis present

## 2015-10-30 DIAGNOSIS — I10 Essential (primary) hypertension: Secondary | ICD-10-CM | POA: Diagnosis present

## 2015-10-30 DIAGNOSIS — I951 Orthostatic hypotension: Secondary | ICD-10-CM | POA: Diagnosis present

## 2015-10-30 DIAGNOSIS — M79673 Pain in unspecified foot: Secondary | ICD-10-CM | POA: Diagnosis present

## 2015-10-30 DIAGNOSIS — R609 Edema, unspecified: Secondary | ICD-10-CM

## 2015-10-30 DIAGNOSIS — E876 Hypokalemia: Secondary | ICD-10-CM | POA: Diagnosis present

## 2015-10-30 DIAGNOSIS — K254 Chronic or unspecified gastric ulcer with hemorrhage: Principal | ICD-10-CM | POA: Diagnosis present

## 2015-10-30 DIAGNOSIS — R161 Splenomegaly, not elsewhere classified: Secondary | ICD-10-CM | POA: Diagnosis present

## 2015-10-30 DIAGNOSIS — F1010 Alcohol abuse, uncomplicated: Secondary | ICD-10-CM | POA: Diagnosis present

## 2015-10-30 DIAGNOSIS — Z8249 Family history of ischemic heart disease and other diseases of the circulatory system: Secondary | ICD-10-CM

## 2015-10-30 DIAGNOSIS — F17210 Nicotine dependence, cigarettes, uncomplicated: Secondary | ICD-10-CM | POA: Diagnosis present

## 2015-10-30 DIAGNOSIS — K7460 Unspecified cirrhosis of liver: Secondary | ICD-10-CM

## 2015-10-30 DIAGNOSIS — K7030 Alcoholic cirrhosis of liver without ascites: Secondary | ICD-10-CM | POA: Diagnosis present

## 2015-10-30 DIAGNOSIS — D696 Thrombocytopenia, unspecified: Secondary | ICD-10-CM | POA: Diagnosis present

## 2015-10-30 DIAGNOSIS — E785 Hyperlipidemia, unspecified: Secondary | ICD-10-CM | POA: Diagnosis present

## 2015-10-30 HISTORY — DX: Other specified anxiety disorders: F41.8

## 2015-10-30 HISTORY — DX: Anemia, unspecified: D64.9

## 2015-10-30 HISTORY — DX: Hyperglycemia, unspecified: R73.9

## 2015-10-30 HISTORY — DX: Gastric ulcer, unspecified as acute or chronic, without hemorrhage or perforation: K25.9

## 2015-10-30 HISTORY — DX: Adverse effect of other nonsteroidal anti-inflammatory drugs (NSAID), initial encounter: T39.395A

## 2015-10-30 HISTORY — DX: Coagulation defect, unspecified: D68.9

## 2015-10-30 HISTORY — DX: Splenomegaly, not elsewhere classified: R16.1

## 2015-10-30 HISTORY — DX: Unspecified protein-calorie malnutrition: E46

## 2015-10-30 HISTORY — DX: Alcohol abuse, uncomplicated: F10.10

## 2015-10-30 HISTORY — DX: Gastrointestinal hemorrhage, unspecified: K92.2

## 2015-10-30 HISTORY — DX: Alcoholic cirrhosis of liver without ascites: K70.30

## 2015-10-30 HISTORY — DX: Fatty (change of) liver, not elsewhere classified: K76.0

## 2015-10-30 HISTORY — DX: Alcoholic hepatitis without ascites: K70.10

## 2015-10-30 HISTORY — DX: Thrombocytopenia, unspecified: D69.6

## 2015-10-30 LAB — URINALYSIS, ROUTINE W REFLEX MICROSCOPIC
GLUCOSE, UA: NEGATIVE mg/dL
Hgb urine dipstick: NEGATIVE
Ketones, ur: 40 mg/dL — AB
Nitrite: POSITIVE — AB
PROTEIN: 30 mg/dL — AB
Specific Gravity, Urine: 1.024 (ref 1.005–1.030)
pH: 7.5 (ref 5.0–8.0)

## 2015-10-30 LAB — COMPREHENSIVE METABOLIC PANEL
ALBUMIN: 2.2 g/dL — AB (ref 3.5–5.0)
ALT: 30 U/L (ref 17–63)
AST: 85 U/L — AB (ref 15–41)
Alkaline Phosphatase: 147 U/L — ABNORMAL HIGH (ref 38–126)
Anion gap: 10 (ref 5–15)
BUN: 13 mg/dL (ref 6–20)
CHLORIDE: 102 mmol/L (ref 101–111)
CO2: 26 mmol/L (ref 22–32)
CREATININE: 0.52 mg/dL — AB (ref 0.61–1.24)
Calcium: 7.8 mg/dL — ABNORMAL LOW (ref 8.9–10.3)
GFR calc Af Amer: >60 mL/min (ref >60)
GLUCOSE: 136 mg/dL — AB (ref 65–99)
Potassium: 2.8 mmol/L — ABNORMAL LOW (ref 3.5–5.1)
Sodium: 138 mmol/L (ref 135–145)
Total Bilirubin: 4.4 mg/dL — ABNORMAL HIGH (ref 0.3–1.2)
Total Protein: 5.4 g/dL — ABNORMAL LOW (ref 6.5–8.1)

## 2015-10-30 LAB — CBC WITH DIFFERENTIAL/PLATELET
BASOS ABS: 0 10*3/uL (ref 0.0–0.1)
BASOS PCT: 0 %
EOS PCT: 0 %
Eosinophils Absolute: 0 10*3/uL (ref 0.0–0.7)
HEMATOCRIT: 27.7 % — AB (ref 39.0–52.0)
HEMOGLOBIN: 9.4 g/dL — AB (ref 13.0–17.0)
LYMPHS ABS: 1.7 10*3/uL (ref 0.7–4.0)
LYMPHS PCT: 33 %
MCH: 38.2 pg — AB (ref 26.0–34.0)
MCHC: 33.9 g/dL (ref 30.0–36.0)
MCV: 112.6 fL — AB (ref 78.0–100.0)
MONOS PCT: 7 %
Monocytes Absolute: 0.4 10*3/uL (ref 0.1–1.0)
NEUTROS ABS: 3.1 10*3/uL (ref 1.7–7.7)
Neutrophils Relative %: 60 %
PLATELETS: 95 10*3/uL — AB (ref 150–400)
RBC: 2.46 MIL/uL — ABNORMAL LOW (ref 4.22–5.81)
RDW: 12.6 % (ref 11.5–15.5)
WBC: 5.2 10*3/uL (ref 4.0–10.5)

## 2015-10-30 LAB — URINE MICROSCOPIC-ADD ON

## 2015-10-30 LAB — PROTIME-INR
INR: 1.18 (ref 0.00–1.49)
PROTHROMBIN TIME: 15.2 s (ref 11.6–15.2)

## 2015-10-30 LAB — I-STAT CG4 LACTIC ACID, ED: LACTIC ACID, VENOUS: 1.75 mmol/L (ref 0.5–2.0)

## 2015-10-30 LAB — ABO/RH: ABO/RH(D): O POS

## 2015-10-30 LAB — PREPARE RBC (CROSSMATCH)

## 2015-10-30 LAB — I-STAT TROPONIN, ED: Troponin i, poc: 0 ng/mL (ref 0.00–0.08)

## 2015-10-30 LAB — ETHANOL: Alcohol, Ethyl (B): <5 mg/dL (ref <5)

## 2015-10-30 LAB — LIPASE, BLOOD: LIPASE: 19 U/L (ref 11–51)

## 2015-10-30 MED ORDER — LORAZEPAM 1 MG PO TABS: 1.0000 mg | ORAL_TABLET | Freq: Four times a day (QID) | ORAL | Status: AC | PRN

## 2015-10-30 MED ORDER — SODIUM CHLORIDE 0.9 % IJ SOLN
3.0000 mL | Freq: Two times a day (BID) | INTRAMUSCULAR | Status: DC
  Administered 2015-10-31 – 2015-11-02 (×5): 3 mL via INTRAVENOUS

## 2015-10-30 MED ORDER — SODIUM CHLORIDE 0.9 % IV SOLN
8.0000 mg/h | INTRAVENOUS | Status: DC
  Administered 2015-10-30 – 2015-11-01 (×4): 8 mg/h via INTRAVENOUS
  Filled 2015-10-30 (×8): qty 80

## 2015-10-30 MED ORDER — FENTANYL CITRATE (PF) 100 MCG/2ML IJ SOLN
25.0000 ug | INTRAMUSCULAR | Status: DC | PRN
  Administered 2015-10-30 – 2015-10-31 (×5): 25 ug via INTRAVENOUS
  Filled 2015-10-30 (×5): qty 2

## 2015-10-30 MED ORDER — SODIUM CHLORIDE 0.9 % IV SOLN: 10.0000 mL/h | Freq: Once | INTRAVENOUS | Status: DC

## 2015-10-30 MED ORDER — PANTOPRAZOLE SODIUM 40 MG IV SOLR: 40.0000 mg | Freq: Two times a day (BID) | INTRAVENOUS | Status: DC

## 2015-10-30 MED ORDER — NICOTINE 21 MG/24HR TD PT24
21.0000 mg | MEDICATED_PATCH | Freq: Once | TRANSDERMAL | Status: AC
  Administered 2015-10-30: 21 mg via TRANSDERMAL
  Filled 2015-10-30 (×2): qty 1

## 2015-10-30 MED ORDER — POTASSIUM CHLORIDE 10 MEQ/100ML IV SOLN
10.0000 meq | INTRAVENOUS | Status: AC
  Administered 2015-10-30 – 2015-10-31 (×4): 10 meq via INTRAVENOUS
  Filled 2015-10-30 (×4): qty 100

## 2015-10-30 MED ORDER — SODIUM CHLORIDE 0.9 % IV SOLN
80.0000 mg | Freq: Once | INTRAVENOUS | Status: AC
  Administered 2015-10-30: 80 mg via INTRAVENOUS
  Filled 2015-10-30: qty 80

## 2015-10-30 MED ORDER — SODIUM CHLORIDE 0.9 % IV SOLN
50.0000 ug/h | INTRAVENOUS | Status: DC
  Administered 2015-10-30 – 2015-10-31 (×2): 50 ug/h via INTRAVENOUS
  Filled 2015-10-30 (×5): qty 1

## 2015-10-30 MED ORDER — LORAZEPAM 2 MG/ML IJ SOLN
1.0000 mg | Freq: Four times a day (QID) | INTRAMUSCULAR | Status: AC | PRN
  Administered 2015-11-01 – 2015-11-02 (×4): 1 mg via INTRAVENOUS
  Filled 2015-10-30 (×5): qty 1

## 2015-10-30 MED ORDER — ONDANSETRON HCL 4 MG PO TABS: 4.0000 mg | ORAL_TABLET | Freq: Four times a day (QID) | ORAL | Status: DC | PRN

## 2015-10-30 MED ORDER — OCTREOTIDE LOAD VIA INFUSION
50.0000 ug | Freq: Once | INTRAVENOUS | Status: AC
  Administered 2015-10-30: 50 ug via INTRAVENOUS
  Filled 2015-10-30 (×2): qty 25

## 2015-10-30 MED ORDER — ONDANSETRON HCL 4 MG/2ML IJ SOLN: 4.0000 mg | Freq: Four times a day (QID) | INTRAMUSCULAR | Status: DC | PRN

## 2015-10-30 MED ORDER — ONDANSETRON HCL 4 MG/2ML IJ SOLN
4.0000 mg | Freq: Once | INTRAMUSCULAR | Status: AC
  Administered 2015-10-30: 4 mg via INTRAVENOUS
  Filled 2015-10-30: qty 2

## 2015-10-30 MED ORDER — FOLIC ACID 1 MG PO TABS
1.0000 mg | ORAL_TABLET | Freq: Every day | ORAL | Status: DC
  Administered 2015-10-30 – 2015-11-03 (×5): 1 mg via ORAL
  Filled 2015-10-30 (×5): qty 1

## 2015-10-30 MED ORDER — PIPERACILLIN-TAZOBACTAM 3.375 G IVPB
3.3750 g | Freq: Three times a day (TID) | INTRAVENOUS | Status: DC
  Administered 2015-10-30 – 2015-10-31 (×2): 3.375 g via INTRAVENOUS
  Filled 2015-10-30 (×2): qty 50

## 2015-10-30 MED ORDER — SODIUM CHLORIDE 0.9 % IV BOLUS (SEPSIS)
1000.0000 mL | Freq: Once | INTRAVENOUS | Status: AC
  Administered 2015-10-30: 1000 mL via INTRAVENOUS

## 2015-10-30 MED ORDER — VITAMIN B-1 100 MG PO TABS
100.0000 mg | ORAL_TABLET | Freq: Every day | ORAL | Status: DC
  Administered 2015-10-30 – 2015-11-03 (×5): 100 mg via ORAL
  Filled 2015-10-30 (×5): qty 1

## 2015-10-30 MED ORDER — LORAZEPAM 2 MG/ML IJ SOLN
0.0000 mg | Freq: Two times a day (BID) | INTRAMUSCULAR | Status: AC
  Administered 2015-11-02: 2 mg via INTRAVENOUS
  Administered 2015-11-02 (×2): 1 mg via INTRAVENOUS
  Administered 2015-11-03: 2 mg via INTRAVENOUS
  Filled 2015-10-30 (×3): qty 1

## 2015-10-30 MED ORDER — FENTANYL CITRATE (PF) 100 MCG/2ML IJ SOLN
50.0000 ug | Freq: Once | INTRAMUSCULAR | Status: AC
  Administered 2015-10-30: 50 ug via INTRAVENOUS
  Filled 2015-10-30: qty 2

## 2015-10-30 MED ORDER — ADULT MULTIVITAMIN W/MINERALS CH
1.0000 | ORAL_TABLET | Freq: Every day | ORAL | Status: DC
  Administered 2015-10-30 – 2015-11-03 (×5): 1 via ORAL
  Filled 2015-10-30 (×5): qty 1

## 2015-10-30 MED ORDER — THIAMINE HCL 100 MG/ML IJ SOLN: 100.0000 mg | Freq: Every day | INTRAMUSCULAR | Status: DC

## 2015-10-30 MED ORDER — LORAZEPAM 2 MG/ML IJ SOLN
0.0000 mg | Freq: Four times a day (QID) | INTRAMUSCULAR | Status: AC
  Administered 2015-10-30: 2 mg via INTRAVENOUS
  Administered 2015-10-31: 1 mg via INTRAVENOUS
  Administered 2015-10-31: 2 mg via INTRAVENOUS
  Administered 2015-11-01 (×2): 1 mg via INTRAVENOUS
  Filled 2015-10-30 (×5): qty 1

## 2015-10-30 NOTE — H&P (Signed)
[facility] History and Physical  Bryan Moyer ZOX:[id number] DOB: November 11, 1965 DOA: 10/30/2015  Referring physician: Dr. Madilyn Hookees. PCP: No PCP Per Patient patient is supposed to follow with [facility]. Specialists: None.  Chief Complaint: Weakness and black stools.  HPI: Bryan Moyer is a 49 y.o. male with history of alcohol abuse presents to the ER because of weakness and black stools. 3 days ago patient has had 3-4 episodes of hematemesis. And over the last 24 hours patient has been feeling weak and dizzy and started noticing black stools. Patient drinks every day. Patient also takes Advil. On exam patient also has lower extremity edema with pain in the both greater toes. Hemoglobin has dropped by 2 g from previous and also patient was having orthostatic changes with tachycardia. ER physician had discussed with on-call gastroenterologist Dr. Marina GoodellPerry who will be seeing patient in consult and patient was started on Protonix infusion and octreotide infusion. Patient has been admitted for further workup of acute GI bleed.   Review of Systems: As presented in the history of presenting illness, rest negative.  Past Medical History  Diagnosis Date  . Hyperlipemia   . Anxiety   . Depression   . Hypertension   . Alcohol abuse   . Tobacco abuse    Past Surgical History  Procedure Laterality Date  . No past surgeries     Social History:  reports that he has been smoking Cigarettes.  He has been smoking about 2.00 packs per day. He does not have any smokeless tobacco history on file. He reports that he drinks alcohol. He reports that he does not use illicit drugs. Where does patient live home. Can patient participate in ADLs? Yes.  No Known Allergies  Family History:  Family History  Problem Relation Age of Onset  . Hyperlipidemia Mother   . Hypertension Mother   . Heart disease Father   . Hyperlipidemia Father   . Stroke Father       Prior to Admission  medications   Medication Sig Start Date End Date Taking? Authorizing Provider  atorvastatin (LIPITOR) 10 MG tablet Take 1 tablet (10 mg total) by mouth daily at 6 PM. 10/05/15  Yes Maryann Mikhail, DO  feeding supplement (BOOST / RESOURCE BREEZE) LIQD Take 1 Container by mouth every morning. 10/05/15  Yes Maryann Mikhail, DO  FLUoxetine (PROZAC) 20 MG tablet Take 1 tablet (20 mg total) by mouth daily. 09/19/15  Yes Elvina SidleKurt Lauenstein, MD  folic acid (FOLVITE) 1 MG tablet Take 1 tablet (1 mg total) by mouth daily. 10/05/15  Yes Maryann Mikhail, DO  gabapentin (NEURONTIN) 100 MG capsule Take 1 capsule (100 mg total) by mouth 3 (three) times daily. 10/05/15  Yes Maryann Mikhail, DO  Ibuprofen-Diphenhydramine HCl (ADVIL PM) 200-25 MG CAPS Take 2 capsules by mouth at bedtime as needed (sleep/ pain).   Yes Historical Provider, MD  metoprolol tartrate (LOPRESSOR) 25 MG tablet Take 0.5 tablets (12.5 mg total) by mouth 2 (two) times daily. 10/05/15  Yes Maryann Mikhail, DO  Multiple Vitamin (MULTIVITAMIN WITH MINERALS) TABS tablet Take 1 tablet by mouth daily. 10/05/15  Yes Maryann Mikhail, DO  thiamine 100 MG tablet Take 1 tablet (100 mg total) by mouth daily. 10/05/15  Yes Maryann Mikhail, DO  nicotine (NICODERM CQ - DOSED IN MG/24 HOURS) 21 mg/24hr patch Place 1 patch (21 mg total) onto the skin daily. Patient not taking: Reported on 10/30/2015 10/05/15   Edsel PetrinMaryann Mikhail, DO    Physical Exam: Filed Vitals:  10/30/15 1818 10/30/15 1930 10/30/15 2005 10/30/15 2024  BP: 119/84 131/92 120/94 121/88  Pulse: 95 86 90 89  Temp:   98.8 F (37.1 C) 97.9 F (36.6 C)  TempSrc:   Oral Oral  Resp: Weight:      SpO2: 100% 100% 100%      General:  Moderately built and nourished.  Eyes: Anicteric mild pallor.  ENT: No discharge from the ears eyes nose and mouth.  Neck: No mass felt.  Cardiovascular: S1-S2 heard.  Respiratory: No rhonchi or crepitations.  Abdomen: Soft nontender bowel sounds  present.  Skin: No rash.  Musculoskeletal: Bilateral foot edema.  Psychiatric: Appears normal.  Neurologic: Alert awake oriented to time place and person. Moves all extremities.  Labs on Admission:  Basic Metabolic Panel:  Recent Labs Lab 10/30/15 1735  NA 138  K 2.8*  CL 102  CO2 26  GLUCOSE 136*  BUN 13  CREATININE 0.52*  CALCIUM 7.8*   Liver Function Tests:  Recent Labs Lab 10/30/15 1735  AST 85*  ALT 30  ALKPHOS 147*  BILITOT 4.4*  PROT 5.4*  ALBUMIN 2.2*    Recent Labs Lab 10/30/15 1735  LIPASE 19   No results for input(s): AMMONIA in the last 168 hours. CBC:  Recent Labs Lab 10/30/15 1735  WBC 5.2  NEUTROABS 3.1  HGB 9.4*  HCT 27.7*  MCV 112.6*  PLT 95*   Cardiac Enzymes: No results for input(s): CKTOTAL, CKMB, CKMBINDEX, TROPONINI in the last 168 hours.  BNP (last 3 results) No results for input(s): BNP in the last 8760 hours.  ProBNP (last 3 results) No results for input(s): PROBNP in the last 8760 hours.  CBG: No results for input(s): GLUCAP in the last 168 hours.  Radiological Exams on Admission: No results found.   Assessment/Plan Principal Problem:   Acute GI bleeding Active Problems:   Alcohol abuse   GI bleed   Acute blood loss anemia   Foot pain   1. Acute GI bleeding - given the history of taking NSAIDs could be gastric ulceration and also could be esophageal varices given the history of alcoholism. At this time patient has been placed on octreotide and Protonix infusion. Gastroenterologist Dr. Marina Goodell was notified by the ER physician. Patient since was tachycardic and orthostatic has been ordered 1 unit of packed red blood cell transfusion. Patient will be kept nothing by mouth in anticipation of procedure. 2. Alcohol abuse - CIWA protocol with Ativan and thiamine. 3. History of hypertension - presently orthostatic changes positive. Closely follow blood pressure trends. When necessary IV labetalol for systolic blood  pressure more than 160. 4. Bilateral foot edema with pain - edema could be from liver pathology. Check Dopplers to rule out DVT. Check uric acid levels. 5. Acute blood loss anemia - follow CBC.   DVT Prophylaxis SCDs.  Code Status: Full code.  Family Communication: Patient's wife at the bedside.  Disposition Plan: Admit to inpatient.    Peyton Spengler N. [facility] Pager [phone].  If 7PM-7AM, please contact night-coverage [email or web address] Password [facility] 10/30/2015, 10:29 PM

## 2015-10-30 NOTE — ED Provider Notes (Signed)
CSN: [id number]     Arrival date & time 10/30/15  1605 History   First MD Initiated Contact with Patient 10/30/15 1643     Chief Complaint  Patient presents with  . Hypotension  . Diarrhea  . Emesis      The history is provided by the patient. No language interpreter was used.   Bryan Moyer is a 49 y.o. male who presents to the Emergency Department complaining of dizziness, vomiting, diarrhea.  Four days ago he decided to stop drinking and three days ago he began vomiting.  He reports multiple episodes of coffee-ground emesis, dark and runny stools.  He reports some abdominal pain.  He also reports two months of dizziness, numbness in his legs, trouble with standing, which makes him more dizzy.  He denies any fevers, chest pain, SOB.  Sxs are moderate, constant, worsening.     Past Medical History  Diagnosis Date  . Hyperlipemia   . Anxiety   . Depression   . Hypertension   . Alcohol abuse   . Tobacco abuse    Past Surgical History  Procedure Laterality Date  . No past surgeries     Family History  Problem Relation Age of Onset  . Hyperlipidemia Mother   . Hypertension Mother   . Heart disease Father   . Hyperlipidemia Father   . Stroke Father    Social History  Substance Use Topics  . Smoking status: Current Every Day Smoker -- 2.00 packs/day    Types: Cigarettes  . Smokeless tobacco: None  . Alcohol Use: Yes     Comment: Daily. 4-6 Vodka drinks. Last drink: 18:00    Review of Systems  All other systems reviewed and are negative.     Allergies  Review of patient's allergies indicates no known allergies.  Home Medications   Prior to Admission medications   Medication Sig Start Date End Date Taking? Authorizing Provider  atorvastatin (LIPITOR) 10 MG tablet Take 1 tablet (10 mg total) by mouth daily at 6 PM. 10/05/15  Yes Maryann Mikhail, DO  feeding supplement (BOOST / RESOURCE BREEZE) LIQD Take 1 Container by mouth every morning. 10/05/15  Yes Maryann  Mikhail, DO  FLUoxetine (PROZAC) 20 MG tablet Take 1 tablet (20 mg total) by mouth daily. 09/19/15  Yes [name], MD  folic acid (FOLVITE) 1 MG tablet Take 1 tablet (1 mg total) by mouth daily. 10/05/15  Yes Maryann Mikhail, DO  gabapentin (NEURONTIN) 100 MG capsule Take 1 capsule (100 mg total) by mouth 3 (three) times daily. 10/05/15  Yes Maryann Mikhail, DO  Ibuprofen-Diphenhydramine HCl (ADVIL PM) 200-25 MG CAPS Take 2 capsules by mouth at bedtime as needed (sleep/ pain).   Yes Historical Provider, MD  metoprolol tartrate (LOPRESSOR) 25 MG tablet Take 0.5 tablets (12.5 mg total) by mouth 2 (two) times daily. 10/05/15  Yes Maryann Mikhail, DO  Multiple Vitamin (MULTIVITAMIN WITH MINERALS) TABS tablet Take 1 tablet by mouth daily. 10/05/15  Yes Maryann Mikhail, DO  thiamine 100 MG tablet Take 1 tablet (100 mg total) by mouth daily. 10/05/15  Yes Maryann Mikhail, DO  nicotine (NICODERM CQ - DOSED IN MG/24 HOURS) 21 mg/24hr patch Place 1 patch (21 mg total) onto the skin daily. Patient not taking: Reported on 10/30/2015 10/05/15   Maryann Mikhail, DO   BP 161/111 mmHg  Pulse 89  Temp(Src) 98.2 F (36.8 C) (Oral)  Resp 17  Ht  (1.803 m)  Wt 151 lb 7.3 oz (68.7 kg)  BMI 21.13 kg/m2  SpO2 100% Physical Exam  Constitutional: He is oriented to person, place, and time. He appears well-developed and well-nourished.  HENT:  Head: Normocephalic and atraumatic.  Cardiovascular: Regular rhythm.   No murmur heard. tachycardic  Pulmonary/Chest: Effort normal and breath sounds normal. No respiratory distress.  Abdominal: Soft. There is no rebound and no guarding.  Mild diffuse abdominal tenderness  Genitourinary:  Black stool, heme positive  Musculoskeletal: He exhibits no edema or tenderness.  Neurological: He is alert and oriented to person, place, and time.  Skin: Skin is warm and dry. No pallor.  Psychiatric: He has a normal mood and affect. His behavior is normal.  Nursing note and  vitals reviewed.   ED Course  Procedures (including critical care time)  CRITICAL CARE Performed by: Tilden FossaElizabeth Camaria Gerald   Total critical care time: 30 minutes  Critical care time was exclusive of separately billable procedures and treating other patients.  Critical care was necessary to treat or prevent imminent or life-threatening deterioration.  Critical care was time spent personally by me on the following activities: development of treatment plan with patient and/or surrogate as well as nursing, discussions with consultants, evaluation of patient's response to treatment, examination of patient, obtaining history from patient or surrogate, ordering and performing treatments and interventions, ordering and review of laboratory studies, ordering and review of radiographic studies, pulse oximetry and re-evaluation of patient's condition.  Labs Review Labs Reviewed  COMPREHENSIVE METABOLIC PANEL - Abnormal; Notable for the following:    Potassium 2.8 (*)    Glucose, Bld 136 (*)    Creatinine, Ser 0.52 (*)    Calcium 7.8 (*)    Total Protein 5.4 (*)    Albumin 2.2 (*)    AST 85 (*)    Alkaline Phosphatase 147 (*)    Total Bilirubin 4.4 (*)    All other components within normal limits  CBC WITH DIFFERENTIAL/PLATELET - Abnormal; Notable for the following:    RBC 2.46 (*)    Hemoglobin 9.4 (*)    HCT 27.7 (*)    MCV 112.6 (*)    MCH 38.2 (*)    Platelets 95 (*)    All other components within normal limits  URINALYSIS, ROUTINE W REFLEX MICROSCOPIC (NOT AT [facility]) - Abnormal; Notable for the following:    Color, Urine BROWN (*)    APPearance CLOUDY (*)    Bilirubin Urine MODERATE (*)    Ketones, ur 40 (*)    Protein, ur 30 (*)    Nitrite POSITIVE (*)    Leukocytes, UA SMALL (*)    All other components within normal limits  URINE MICROSCOPIC-ADD ON - Abnormal; Notable for the following:    Squamous Epithelial / LPF 0-5 (*)    Bacteria, UA FEW (*)    Casts HYALINE CASTS (*)     All other components within normal limits  GLUCOSE, CAPILLARY - Abnormal; Notable for the following:    Glucose-Capillary 101 (*)    All other components within normal limits  URINE CULTURE  MRSA PCR SCREENING  LIPASE, BLOOD  PROTIME-INR  ETHANOL  CBC WITH DIFFERENTIAL/PLATELET  COMPREHENSIVE METABOLIC PANEL  URIC ACID  MAGNESIUM  I-STAT TROPOININ, ED  I-STAT CG4 LACTIC ACID, ED  TYPE AND SCREEN  PREPARE RBC (CROSSMATCH)  ABO/RH    Imaging Review No results found. I have personally reviewed and evaluated these images and lab results as part of my medical decision-making.   EKG Interpretation None  MDM   Final diagnoses:  Edema    Pt with hx/o EtOH abuse here for vomiting, diarrhea, dizziness.  Pt hypotensive on ED arrival, melanotic stool in rectal vault.  Concern for significant upper GI bleed.  Given patients hx/o alcohol abuse as well as liver disease he was started on protonix and octreotide drips.  He has a significant drop in his hemoglobin from a few weeks ago - will transfuse. D/w Dr. Marina Goodell with GI who will see the patient in consult.  Discussed with the hospitalist regarding admission for further work up and treatment.  Discussed with patient findings of studies and recommendation for transfusion and admission and patient is in agreement with plan.      Tilden Fossa, MD 10/31/15 [phone]

## 2015-10-30 NOTE — Progress Notes (Addendum)
Pt without pcp  During pt last hospitalization [facility] Night[facility] for 10/17/15 The pt informs this ED Cm that he had car trouble and had to reschedule the appt States when [facility] he as informed to "call back after January third" to reschedule Cm encouraged to [facility] as [facility]  [facility] CM spoke with pt who [place]cp.  CM discussed and provided written information for uninsured accepting pcps, discussed the importance of pcp vs EDP services for f/u care, www.needymeds.org, www.goodrx.com, discounted pharmacies and other [place] resources such as [facility] , [facility], affordable care act, financial assistance, uninsured dental services, [place] med assist, DSS and  health department  Reviewed resources for [place] uninsured accepting pcps like [facility], family medicine at [address], [facility], [facility], local urgent care centers, [facility], [facility] family practice, general medical clinics, family services of the [place], [facility] urgent care plus others, medication resources, [facility] out patient pharmacies and housing Pt voiced understanding and appreciation of resources provided   Provided [facility] contact information Pt agreed to a referral Cm completed referral Pt to be contact by [facility] clinical liason  Entered in d/c instructions Amao, [place] Schedule an appointment as soon as possible for a visit on 11/06/2015 Follow up by calling the [place] and wellness doctor as [facility] instructed you to reschedule your previously scheduled 10/17/15 appt with Dr Venetia NightAmao [address] [place] [address] [phone] Please use the resources provided to you in emergency room by case manager to assist with doctor for follow up if unable to obtain an appointment with [facility] Call on 11/06/2015 A referral for you has been sent to [facility]  if you have not received a call in 3 days you may contact them Call Scherry RanKaren Andrianos at [phone] Tuesday-Friday [email or web address] These [place] uninsured resources provide possible primary care providers, resources for discounted medications, housing, dental resources, affordable care act information, plus other resources for [place]

## 2015-10-30 NOTE — ED Notes (Signed)
Pt states that he has been light headed with N/V/D.  Pt is a daily drinker of vodka (5 drinks per day).  He states that he stopped drinking around [place] on Friday.

## 2015-10-30 NOTE — ED Notes (Signed)
Nurse drawing labs. 

## 2015-10-31 ENCOUNTER — Encounter (HOSPITAL_COMMUNITY): Payer: Self-pay | Admitting: Physician Assistant

## 2015-10-31 ENCOUNTER — Encounter (HOSPITAL_COMMUNITY)
Admission: EM | Disposition: A | Discharge status: Home / Self Care | Payer: Self-pay | Source: Home / Self Care | Attending: Internal Medicine

## 2015-10-31 ENCOUNTER — Inpatient Hospital Stay (HOSPITAL_COMMUNITY): Payer: MEDICAID

## 2015-10-31 ENCOUNTER — Ambulatory Visit (HOSPITAL_COMMUNITY): Payer: Self-pay | Attending: Internal Medicine

## 2015-10-31 DIAGNOSIS — R945 Abnormal results of liver function studies: Secondary | ICD-10-CM

## 2015-10-31 DIAGNOSIS — K922 Gastrointestinal hemorrhage, unspecified: Secondary | ICD-10-CM

## 2015-10-31 DIAGNOSIS — D7589 Other specified diseases of blood and blood-forming organs: Secondary | ICD-10-CM

## 2015-10-31 DIAGNOSIS — D696 Thrombocytopenia, unspecified: Secondary | ICD-10-CM

## 2015-10-31 DIAGNOSIS — I951 Orthostatic hypotension: Secondary | ICD-10-CM

## 2015-10-31 DIAGNOSIS — D689 Coagulation defect, unspecified: Secondary | ICD-10-CM

## 2015-10-31 DIAGNOSIS — R609 Edema, unspecified: Secondary | ICD-10-CM

## 2015-10-31 DIAGNOSIS — R7989 Other specified abnormal findings of blood chemistry: Secondary | ICD-10-CM | POA: Diagnosis present

## 2015-10-31 DIAGNOSIS — E876 Hypokalemia: Secondary | ICD-10-CM | POA: Diagnosis present

## 2015-10-31 DIAGNOSIS — M7989 Other specified soft tissue disorders: Secondary | ICD-10-CM | POA: Insufficient documentation

## 2015-10-31 DIAGNOSIS — K259 Gastric ulcer, unspecified as acute or chronic, without hemorrhage or perforation: Secondary | ICD-10-CM | POA: Diagnosis present

## 2015-10-31 DIAGNOSIS — T39395A Adverse effect of other nonsteroidal anti-inflammatory drugs [NSAID], initial encounter: Secondary | ICD-10-CM

## 2015-10-31 DIAGNOSIS — E440 Moderate protein-calorie malnutrition: Secondary | ICD-10-CM

## 2015-10-31 DIAGNOSIS — F1010 Alcohol abuse, uncomplicated: Secondary | ICD-10-CM

## 2015-10-31 DIAGNOSIS — M79673 Pain in unspecified foot: Secondary | ICD-10-CM

## 2015-10-31 HISTORY — DX: Coagulation defect, unspecified: D68.9

## 2015-10-31 HISTORY — PX: ESOPHAGOGASTRODUODENOSCOPY: SHX5428

## 2015-10-31 HISTORY — DX: Thrombocytopenia, unspecified: D69.6

## 2015-10-31 LAB — COMPREHENSIVE METABOLIC PANEL
ALBUMIN: 2.4 g/dL — AB (ref 3.5–5.0)
ALT: 31 U/L (ref 17–63)
AST: 87 U/L — AB (ref 15–41)
Alkaline Phosphatase: 158 U/L — ABNORMAL HIGH (ref 38–126)
Anion gap: 12 (ref 5–15)
BILIRUBIN TOTAL: 3.5 mg/dL — AB (ref 0.3–1.2)
BUN: 9 mg/dL (ref 6–20)
CHLORIDE: 104 mmol/L (ref 101–111)
CO2: 24 mmol/L (ref 22–32)
Calcium: 7.8 mg/dL — ABNORMAL LOW (ref 8.9–10.3)
Creatinine, Ser: 0.5 mg/dL — ABNORMAL LOW (ref 0.61–1.24)
GFR calc Af Amer: >60 mL/min (ref >60)
GFR calc non Af Amer: >60 mL/min (ref >60)
GLUCOSE: 89 mg/dL (ref 65–99)
POTASSIUM: 3.4 mmol/L — AB (ref 3.5–5.1)
Sodium: 140 mmol/L (ref 135–145)
TOTAL PROTEIN: 5.9 g/dL — AB (ref 6.5–8.1)

## 2015-10-31 LAB — CBC WITH DIFFERENTIAL/PLATELET
BASOS ABS: 0 10*3/uL (ref 0.0–0.1)
BASOS PCT: 0 %
Eosinophils Absolute: 0 10*3/uL (ref 0.0–0.7)
Eosinophils Relative: 1 %
HEMATOCRIT: 34.8 % — AB (ref 39.0–52.0)
HEMOGLOBIN: 11.3 g/dL — AB (ref 13.0–17.0)
Lymphocytes Relative: 43 %
Lymphs Abs: 2.1 10*3/uL (ref 0.7–4.0)
MCH: 35.4 pg — ABNORMAL HIGH (ref 26.0–34.0)
MCHC: 32.5 g/dL (ref 30.0–36.0)
MCV: 109.1 fL — ABNORMAL HIGH (ref 78.0–100.0)
MONO ABS: 0.4 10*3/uL (ref 0.1–1.0)
Monocytes Relative: 8 %
NEUTROS ABS: 2.3 10*3/uL (ref 1.7–7.7)
NEUTROS PCT: 48 %
Platelets: 89 10*3/uL — ABNORMAL LOW (ref 150–400)
RBC: 3.19 MIL/uL — ABNORMAL LOW (ref 4.22–5.81)
RDW: 17.2 % — AB (ref 11.5–15.5)
WBC: 4.8 10*3/uL (ref 4.0–10.5)

## 2015-10-31 LAB — GLUCOSE, CAPILLARY
GLUCOSE-CAPILLARY: 101 mg/dL — AB (ref 65–99)
GLUCOSE-CAPILLARY: 76 mg/dL (ref 65–99)
GLUCOSE-CAPILLARY: 82 mg/dL (ref 65–99)

## 2015-10-31 LAB — MRSA PCR SCREENING: MRSA by PCR: NEGATIVE

## 2015-10-31 LAB — HEMOGLOBIN AND HEMATOCRIT, BLOOD
HCT: 40.4 % (ref 39.0–52.0)
HEMOGLOBIN: 13 g/dL (ref 13.0–17.0)

## 2015-10-31 LAB — MAGNESIUM: Magnesium: 1.7 mg/dL (ref 1.7–2.4)

## 2015-10-31 LAB — URIC ACID: Uric Acid, Serum: 6.8 mg/dL (ref 4.4–7.6)

## 2015-10-31 SURGERY — EGD (ESOPHAGOGASTRODUODENOSCOPY)
Anesthesia: Moderate Sedation

## 2015-10-31 MED ORDER — OXYCODONE HCL 5 MG PO TABS
5.0000 mg | ORAL_TABLET | ORAL | Status: AC | PRN
  Administered 2015-10-31 – 2015-11-01 (×2): 5 mg via ORAL
  Filled 2015-10-31 (×2): qty 1

## 2015-10-31 MED ORDER — MIDAZOLAM HCL 10 MG/2ML IJ SOLN
INTRAMUSCULAR | Status: DC | PRN
  Administered 2015-10-31: 1 mg via INTRAVENOUS
  Administered 2015-10-31: 2 mg via INTRAVENOUS
  Administered 2015-10-31: 1 mg via INTRAVENOUS
  Administered 2015-10-31: 2 mg via INTRAVENOUS

## 2015-10-31 MED ORDER — MIDAZOLAM HCL 5 MG/ML IJ SOLN
INTRAMUSCULAR | Status: AC
  Filled 2015-10-31: qty 2

## 2015-10-31 MED ORDER — FENTANYL CITRATE (PF) 100 MCG/2ML IJ SOLN
INTRAMUSCULAR | Status: AC
  Filled 2015-10-31: qty 2

## 2015-10-31 MED ORDER — SODIUM CHLORIDE 0.9 % IV SOLN
INTRAVENOUS | Status: DC
  Administered 2015-10-31: 500 mL via INTRAVENOUS

## 2015-10-31 MED ORDER — DIPHENHYDRAMINE HCL 50 MG/ML IJ SOLN
INTRAMUSCULAR | Status: DC | PRN
  Administered 2015-10-31: 50 mg via INTRAVENOUS

## 2015-10-31 MED ORDER — LABETALOL HCL 5 MG/ML IV SOLN
5.0000 mg | INTRAVENOUS | Status: DC | PRN
  Filled 2015-10-31: qty 4

## 2015-10-31 MED ORDER — FENTANYL CITRATE (PF) 100 MCG/2ML IJ SOLN
INTRAMUSCULAR | Status: DC | PRN
  Administered 2015-10-31 (×4): 25 ug via INTRAVENOUS

## 2015-10-31 MED ORDER — HYDRALAZINE HCL 20 MG/ML IJ SOLN
5.0000 mg | Freq: Once | INTRAMUSCULAR | Status: AC
  Administered 2015-10-31: 5 mg via INTRAVENOUS
  Filled 2015-10-31: qty 1

## 2015-10-31 MED ORDER — DEXTROSE 5 % IV SOLN
1.0000 g | Freq: Once | INTRAVENOUS | Status: AC
  Administered 2015-10-31: 1 g via INTRAVENOUS
  Filled 2015-10-31: qty 10

## 2015-10-31 MED ORDER — DIPHENHYDRAMINE HCL 50 MG/ML IJ SOLN
INTRAMUSCULAR | Status: AC
  Filled 2015-10-31: qty 1

## 2015-10-31 MED ORDER — NICOTINE 21 MG/24HR TD PT24
21.0000 mg | MEDICATED_PATCH | Freq: Every day | TRANSDERMAL | Status: DC
  Administered 2015-10-31 – 2015-11-03 (×4): 21 mg via TRANSDERMAL
  Filled 2015-10-31 (×4): qty 1

## 2015-10-31 NOTE — Interval H&P Note (Signed)
History and Physical Interval Note:  10/31/2015 2:28 PM  Bryan Moyer  has presented today for surgery, with the diagnosis of gi bleed  The various methods of treatment have been discussed with the patient and family. After consideration of risks, benefits and other options for treatment, the patient has consented to  Procedure(s): ESOPHAGOGASTRODUODENOSCOPY (EGD) (N/A) as a surgical intervention .  The patient's history has been reviewed, patient examined, no change in status, stable for surgery.  I have reviewed the patient's chart and labs.  Questions were answered to the patient's satisfaction.     [name]

## 2015-10-31 NOTE — Progress Notes (Signed)
Progress Note   Bryan Moyer ZOX:[id number] DOB: 1965-11-29 DOA: 10/30/2015 PCP: No PCP Per Patient   Brief Narrative:   Bryan Moyer is an 49 y.o. male the PMH of alcohol abuse who was admitted 10/30/15 with a chief complaint of weakness and hematemesis/melena in the setting of NSAID use.  Assessment/Plan:   Principal Problem:   Acute GI bleeding with acute blood loss anemia and orthostatic hypotension - Continue PPI and octreotide. Switch Zosyn to Rocephin. Continue IV fluid hydration. - GI consultation requested with plans for EGD today. - 1 unit of PRBCs ordered. - Monitor H and H closely.  Active Problems:   Moderate protein calorie malnutrition - Dietitian consultation once diet advanced. Currently nothing by mouth in anticipation of EGD.    Probable cirrhosis/coagulopathy/thrombocytopenia/elevated LFTs - LFT elevation, history of alcoholism, elevated INR and low platelets all suggest alcohol-related liver disease. - Abdominal ultrasound ordered to evaluate for cirrhotic changes.    Hypokalemia - Supplementing.    Alcohol abuse - Continue alcohol detoxification per CIWA protocol. - Social worker consultation for substance abuse counseling requested. - Supplement thiamine and folic acid.    Foot pain - Follow-up lower extremity Dopplers and uric acid level.    DVT Prophylaxis - SCDs ordered.   Family Communication/Anticipated D/C date and plan/Code Status   Family Communication: Declines my offer to call family, none present. Disposition Plan: Home when stable.  Lives with his mother and sister. Anticipated D/C date:   2-3 days if hemoglobin stable. Code Status: Full code.   IV Access:    Peripheral IV   Procedures and diagnostic studies:   No results found.   Medical Consultants:    Gastroenterology  Anti-Infectives:    Zosyn 10/30/15 ---> 10/31/15  Rocephin 10/31/15--->  Subjective:   Bryan Moyer denies N/V.  No  diarrhea today.  Denies pain.  Last BM was yesterday, stools were "dark".  No tremors.  Objective:    Filed Vitals:   10/31/15 0530 10/31/15 0600 10/31/15 0630 10/31/15 0700  BP:  113/66  107/66  Pulse: 92 95 91 94  Temp:      TempSrc:      Resp: Height:      Weight:      SpO2: 97% 97% 96% 96%    Intake/Output Summary (Last 24 hours) at 10/31/15 0713 Last data filed at 10/31/15 0515  Gross per 24 hour  Intake 4095.42 ml  Output    100 ml  Net 3995.42 ml   Filed Weights   10/30/15 1612 10/30/15 2200  Weight: 69.854 kg (154 lb) 68.7 kg (151 lb 7.3 oz)    Exam: Gen:  NAD Cardiovascular:  RRR, No M/R/G Respiratory:  Lungs CTAB Gastrointestinal:  Abdomen soft, NT/ND, + BS Extremities:  No C/E/C   Data Reviewed:    Labs: Basic Metabolic Panel:  Recent Labs Lab 10/30/15 1735 10/31/15 0330  NA 138 140  K 2.8* 3.4*  CL 102 104  CO2 26 24  GLUCOSE 136* 89  BUN 13 9  CREATININE 0.52* 0.50*  CALCIUM 7.8* 7.8*  MG  --  1.7   GFR Estimated Creatinine Clearance: 106.2 mL/min (by C-G formula based on Cr of 0.5). Liver Function Tests:  Recent Labs Lab 10/30/15 1735 10/31/15 0330  AST 85* 87*  ALT 30 31  ALKPHOS 147* 158*  BILITOT 4.4* 3.5*  PROT 5.4* 5.9*  ALBUMIN 2.2* 2.4*    Recent Labs Lab 10/30/15  1735  LIPASE 19   Coagulation profile  Recent Labs Lab 10/30/15 1735  INR 1.18    CBC:  Recent Labs Lab 10/30/15 1735 10/31/15 0330  WBC 5.2 4.8  NEUTROABS 3.1 2.3  HGB 9.4* 11.3*  HCT 27.7* 34.8*  MCV 112.6* 109.1*  PLT 95* 89*   CBG:  Recent Labs Lab 10/31/15 0031  GLUCAP 101*   Sepsis Labs:  Recent Labs Lab 10/30/15 1735 10/30/15 1745 10/31/15 0330  WBC 5.2  --  4.8  LATICACIDVEN  --  1.75  --    Microbiology Recent Results (from the past 240 hour(s))  MRSA PCR Screening     Status: None   Collection Time: 10/31/15 12:06 AM  Result Value Ref Range Status   MRSA by PCR NEGATIVE NEGATIVE Final     Comment:        The GeneXpert MRSA Assay (FDA approved for NASAL specimens only), is one component of a comprehensive MRSA colonization surveillance program. It is not intended to diagnose MRSA infection nor to guide or monitor treatment for MRSA infections.      Medications:   . folic acid  1 mg Oral Daily  . LORazepam  0-4 mg Intravenous Q6H   Followed by  . [START ON 11/01/2015] LORazepam  0-4 mg Intravenous Q12H  . multivitamin with minerals  1 tablet Oral Daily  . nicotine  21 mg Transdermal Once  . [START ON 11/03/2015] pantoprazole (PROTONIX) IV  40 mg Intravenous Q12H  . piperacillin-tazobactam (ZOSYN)  IV  3.375 g Intravenous 3 times per day  . sodium chloride  3 mL Intravenous Q12H  . thiamine  100 mg Oral Daily   Or  . thiamine  100 mg Intravenous Daily   Continuous Infusions: . octreotide  (SANDOSTATIN)    IV infusion 50 mcg/hr (10/31/15 0407)  . pantoprozole (PROTONIX) infusion 8 mg/hr (10/30/15 1856)    Time spent: 35 minutes.  The patient is medically complex with multiple co-morbidities and is at high risk for clinical deterioration and requires high complexity decision making and coordination of care with subspecialist.    LOS: 1 day   Roy Tokarz  [facility] Pager [phone]. If unable to reach me by pager, please call my cell phone at [phone].  *Please refer to [email or web address], password [email or web address] to get updated schedule on who will round on this patient, as hospitalists switch teams weekly. If 7PM-7AM, please contact night-coverage at [email or web address], password [email or web address] for any overnight needs.  10/31/2015, 7:13 AM

## 2015-10-31 NOTE — Progress Notes (Signed)
CSW consulted to assist with medications at d/c. CSW is unable to assist with home meds. In some cases RNCM may be able to provide medication assistance. Please consult CM. CSW signing off.  Cori RazorJamie Akyah Lagrange LCSW [phone]

## 2015-10-31 NOTE — Progress Notes (Signed)
Date: October 31, 2015 Chart reviewed for concurrent status and case management needs. Will continue to follow patient for changes and needs: Terrez Ander, RN, BSN, CCM   [phone] 

## 2015-10-31 NOTE — Progress Notes (Signed)
Received call from 3W at 2045 to get report on pt coming to 1335. Report given.  Transfer orders reviewed.  Waiting on Protonix gtt to arrive from pharmacy and then pt will be transferred.

## 2015-10-31 NOTE — Consult Note (Signed)
Consultation  Referring Provider: [facility] Primary Care Physician:  No PCP Per Patient Primary Gastroenterologist:  None/unassigned.  Reason for Consultation:  Gi Bleed  HPI: Bryan Moyer is a 49 y.o. male alcoholic admitted through the emergency room last evening with complaints of 3-4 day history of weakness and black stool. Apparently bout 3 days prior to that he had some episodes of hematemesis. He admits to drinking 6 vodkas per day and has been doing this long-term and also has been taking a lot of Advil recently for leg and foot pain. He has not had any prior episodes of GI bleeding or endoscopic evaluation. He currently denies any abdominal pain. He has been hemodynamically stable and was placed on octreotide and IV PPI infusion on admission. On admit potassium was 2.8 total bili 4.4 alkaline phosphatase 147 and AST of 85. Hemoglobin 9.4 on admission platelets 85, pro time 15.2 INR of 1.18.Marland Kitchen. No hematemesis since admission, he has not been given a definite diagnosis of cirrhosis. Exline Other medical problems include hyperlipidemia, hypertension and depression   Past Medical History  Diagnosis Date  . Hyperlipemia   . Depression with anxiety 03/2014  . Hypertension   . Alcoholic hepatitis 09/2015    Hep A/B/C serologies, HIV all negative 09/2015.   . Tobacco abuse   . Anemia 10/2015    Macrocytic.   . Malnutrition (HCC) 10/2015    moderate, non-severe.   Marland Kitchen. ETOH abuse 03/2014  . Hyperglycemia 09/2015    Past Surgical History  Procedure Laterality Date  . No past surgeries      Prior to Admission medications   Medication Sig Start Date End Date Taking? Authorizing Provider  atorvastatin (LIPITOR) 10 MG tablet Take 1 tablet (10 mg total) by mouth daily at 6 PM. 10/05/15  Yes Maryann Mikhail, DO  feeding supplement (BOOST / RESOURCE BREEZE) LIQD Take 1 Container by mouth every morning. 10/05/15  Yes Maryann Mikhail, DO  FLUoxetine (PROZAC) 20 MG tablet Take 1  tablet (20 mg total) by mouth daily. 09/19/15  Yes Elvina SidleKurt Lauenstein, MD  folic acid (FOLVITE) 1 MG tablet Take 1 tablet (1 mg total) by mouth daily. 10/05/15  Yes Maryann Mikhail, DO  gabapentin (NEURONTIN) 100 MG capsule Take 1 capsule (100 mg total) by mouth 3 (three) times daily. 10/05/15  Yes Maryann Mikhail, DO  Ibuprofen-Diphenhydramine HCl (ADVIL PM) 200-25 MG CAPS Take 2 capsules by mouth at bedtime as needed (sleep/ pain).   Yes Historical Provider, MD  metoprolol tartrate (LOPRESSOR) 25 MG tablet Take 0.5 tablets (12.5 mg total) by mouth 2 (two) times daily. 10/05/15  Yes Maryann Mikhail, DO  Multiple Vitamin (MULTIVITAMIN WITH MINERALS) TABS tablet Take 1 tablet by mouth daily. 10/05/15  Yes Maryann Mikhail, DO  thiamine 100 MG tablet Take 1 tablet (100 mg total) by mouth daily. 10/05/15  Yes Maryann Mikhail, DO  nicotine (NICODERM CQ - DOSED IN MG/24 HOURS) 21 mg/24hr patch Place 1 patch (21 mg total) onto the skin daily. Patient not taking: Reported on 10/30/2015 10/05/15   Edsel PetrinMaryann Mikhail, DO    Current Facility-Administered Medications  Medication Dose Route Frequency Provider Last Rate Last Dose  . cefTRIAXone (ROCEPHIN) 1 g in dextrose 5 % 50 mL IVPB  1 g Intravenous Once Marnie Fazzino S Tanis Burnley, PA-C      . fentaNYL (SUBLIMAZE) injection 25 mcg  25 mcg Intravenous Q3H PRN Eduard ClosArshad N Kakrakandy, MD   25 mcg at 10/31/15 0758  . folic acid (FOLVITE) tablet 1  mg  1 mg Oral Daily Eduard Clos, MD   1 mg at 10/30/15 2308  . labetalol (NORMODYNE,TRANDATE) injection 5 mg  5 mg Intravenous Q2H PRN Eduard Clos, MD      . LORazepam (ATIVAN) injection 0-4 mg  0-4 mg Intravenous Q6H Eduard Clos, MD   2 mg at 10/31/15 0406   Followed by  . [START ON 11/01/2015] LORazepam (ATIVAN) injection 0-4 mg  0-4 mg Intravenous Q12H Eduard Clos, MD      . LORazepam (ATIVAN) tablet 1 mg  1 mg Oral Q6H PRN Eduard Clos, MD       Or  . LORazepam (ATIVAN) injection 1 mg  1 mg  Intravenous Q6H PRN Eduard Clos, MD      . multivitamin with minerals tablet 1 tablet  1 tablet Oral Daily Eduard Clos, MD   1 tablet at 10/30/15 2308  . nicotine (NICODERM CQ - dosed in mg/24 hours) patch 21 mg  21 mg Transdermal Once Tilden Fossa, MD   21 mg at 10/30/15 1814  . octreotide (SANDOSTATIN) 500 mcg in sodium chloride 0.9 % 250 mL (2 mcg/mL) infusion  50 mcg/hr Intravenous Continuous Tilden Fossa, MD 25 mL/hr at 10/31/15 0407 50 mcg/hr at 10/31/15 0407  . ondansetron (ZOFRAN) tablet 4 mg  4 mg Oral Q6H PRN Eduard Clos, MD       Or  . ondansetron [facility]) injection 4 mg  4 mg Intravenous Q6H PRN Eduard Clos, MD      . pantoprazole (PROTONIX) 80 mg in sodium chloride 0.9 % 250 mL (0.32 mg/mL) infusion  8 mg/hr Intravenous Continuous Tilden Fossa, MD 25 mL/hr at 10/31/15 0907 8 mg/hr at 10/31/15 0907  . [START ON 11/03/2015] pantoprazole (PROTONIX) injection 40 mg  40 mg Intravenous Q12H Tilden Fossa, MD      . sodium chloride 0.9 % injection 3 mL  3 mL Intravenous Q12H Eduard Clos, MD   3 mL at 10/31/15 0258  . thiamine (VITAMIN B-1) tablet 100 mg  100 mg Oral Daily Eduard Clos, MD   100 mg at 10/30/15 2308   Or  . thiamine (B-1) injection 100 mg  100 mg Intravenous Daily Eduard Clos, MD        Allergies as of 10/30/2015  . (No Known Allergies)    Family History  Problem Relation Age of Onset  . Hyperlipidemia Mother   . Hypertension Mother   . Heart disease Father   . Hyperlipidemia Father   . Stroke Father     Social History   Social History  . Marital Status: Single    Spouse Name: N/A  . Number of Children: N/A  . Years of Education: N/A   Occupational History  . Not on file.   Social History Main Topics  . Smoking status: Current Every Day Smoker -- 2.00 packs/day    Types: Cigarettes  . Smokeless tobacco: Not on file  . Alcohol Use: Yes     Comment: Daily. 4-6 Vodka drinks. Last drink: 18:00    . Drug Use: No  . Sexual Activity: Not on file   Other Topics Concern  . Not on file   Social History Narrative    Review of Systems: Pertinent positive and negative review of systems were noted in the above HPI section.  All other review of systems was otherwise negative.  Physical Exam: Vital signs in last 24 hours: Temp:  [97.8 F (  36.6 C)-98.8 F (37.1 C)] 97.8 F (36.6 C) (12/28 0757) Pulse Rate:  [86-106] 94 (12/28 0700) Resp:  [8-25] 21 (12/28 0700) BP: (79-165)/(59-125) 107/66 mmHg (12/28 0700) SpO2:  [96 %-100 %] 96 % (12/28 0700) Weight:  [151 lb 7.3 oz (68.7 kg)-154 lb (69.854 kg)] 151 lb 7.3 oz (68.7 kg) (12/27 2200) Last BM Date: 10/30/15 General:   Alert,  Well-developed, WM in  pleasant and cooperative in NAD Head:  Normocephalic and atraumatic. Eyes:  Sclera clear, no icterus.   Conjunctiva pink. Ears:  Normal auditory acuity. Nose:  No deformity, discharge,  or lesions. Mouth:  No deformity or lesions.   Neck:  Supple; no masses or thyromegaly. Lungs:  Clear throughout to auscultation.   No wheezes, crackles, or rhonchi. Heart:  Regular rate and rhythm; no murmurs, clicks, rubs,  or gallops. Abdomen:  Soft,nontender, BS active,nonpalp mass or hsm, no definite fluid wave.   Rectal:  Deferred  Msk:  Symmetrical without gross deformities. . Pulses:  Normal pulses noted. Extremities:  Without clubbing or edema. Neurologic:  Alert and  oriented x4;  grossly normal neurologically. Skin:  Intact without significant lesions or rashes.. Psych:  Alert and cooperative. Normal mood and affect.  Intake/Output from previous day: 12/27 0701 - 12/28 0700 In: 4095.4 [I.V.:3260.4; Blood:335; IV Piggyback:500] Out: 100 [Urine:100] Intake/Output this shift:    Lab Results:  Recent Labs  10/30/15 1735 10/31/15 0330  WBC 5.2 4.8  HGB 9.4* 11.3*  HCT 27.7* 34.8*  PLT 95* 89*   BMET  Recent Labs  10/30/15 1735 10/31/15 0330  NA 138 140  K 2.8* 3.4*  CL  102 104  CO2 26 24  GLUCOSE 136* 89  BUN 13 9  CREATININE 0.52* 0.50*  CALCIUM 7.8* 7.8*   LFT  Recent Labs  10/31/15 0330  PROT 5.9*  ALBUMIN 2.4*  AST 87*  ALT 31  ALKPHOS 158*  BILITOT 3.5*   PT/INR  Recent Labs  10/30/15 1735  LABPROT 15.2  INR 1.18     IMPRESSION:   #41 49 year old white male alcoholic presenting with weakness and melena. This is in the setting of heavy EtOH and NSAID use. No prior history of GI bleeding. Will need to rule out peptic ulcer disease, Mallory-Weiss tear, versus variceal bleed #2 hypokalemia-correcting #3 thrombocytopenia, hypoalbuminemia, and elevated LFTs-consistent with alcoholic hepatitis versus cirrhosis #4 history of depression #5 history of hypertension 6 history of hyperlipidemia  Plan; Continue octreotide for now as well as Protonix infusion Have scheduled patient for EGD today with Dr. Sissy Hoff discussed in detail with the patient and he is agreeable to proceed Suspect will have alcohol withdrawal- protocol ordered Check acute hepatitis serologies Schedule for upper abdominal ultrasound in a.m. Tomorrow Stop Zosyn Start Rocephin Further plans pending results of EGD.      Ahyan Kreeger  10/31/2015, 10:05 AM

## 2015-10-31 NOTE — Progress Notes (Signed)
Pt to be transferred to 3W. Called at 1934 to give report. Unable to take report at this time.  3W is waiting on a nurse that has been called in to arrive.  Will call back to get report shortly.

## 2015-10-31 NOTE — Progress Notes (Signed)
ANTIBIOTIC CONSULT NOTE - INITIAL  Pharmacy Consult for Zosyn Indication: Intra-abdominal infection  No Known Allergies  Patient Measurements: Weight: 154 lb (69.854 kg) Adjusted Body Weight:   Vital Signs: Temp: 98.2 F (36.8 C) (12/27 2327) Temp Source: Oral (12/27 2327) BP: 161/111 mmHg (12/28 0100) Pulse Rate: 89 (12/28 0100) Intake/Output from previous day:   Intake/Output from this shift:    Labs:  Recent Labs  10/30/15 1735  WBC 5.2  HGB 9.4*  PLT 95*  CREATININE 0.52*   Estimated Creatinine Clearance: 108 mL/min (by C-G formula based on Cr of 0.52). No results for input(s): VANCOTROUGH, VANCOPEAK, VANCORANDOM, GENTTROUGH, GENTPEAK, GENTRANDOM, TOBRATROUGH, TOBRAPEAK, TOBRARND, AMIKACINPEAK, AMIKACINTROU, AMIKACIN in the last 72 hours.   Microbiology: Recent Results (from the past 720 hour(s))  Urine culture     Status: None   Collection Time: 10/02/15 11:06 PM  Result Value Ref Range Status   Specimen Description URINE, CLEAN CATCH  Final   Special Requests NONE  Final   Culture   Final    7,000 COLONIES/mL INSIGNIFICANT GROWTH Performed at [facility]    Report Status 10/04/2015 FINAL  Final    Medical History: Past Medical History  Diagnosis Date  . Hyperlipemia   . Anxiety   . Depression   . Hypertension   . Alcohol abuse   . Tobacco abuse     Medications:  Anti-infectives    Start     Dose/Rate Route Frequency Ordered Stop   10/30/15 2300  piperacillin-tazobactam (ZOSYN) IVPB 3.375 g     3.375 g 12.5 mL/hr over 240 Minutes Intravenous 3 times per day 10/30/15 2245       Assessment: Patient with intra-abdominal infection.  Goal of Therapy:  Zosyn based on renal function Appropriate antibiotic dosing for renal function; eradication of infection   Plan:  Follow up culture results  Zosyn 3.375g IV Q8H infused over 4hrs.   Darlina GuysGrimsley Jr, Jacquenette ShoneJulian Crowford 10/31/2015,1:20 AM

## 2015-10-31 NOTE — Op Note (Signed)
[facility] [address] [place], [id number]   ENDOSCOPY PROCEDURE REPORT  PATIENT: Bryan Moyer, Bryan Moyer  MR#: [id number] BIRTHDATE: 11/23/1965 , 49  yrs. old GENDER: male ENDOSCOPIST: Benancio DeedsSteven P Vaughn Beaumier, MD REFERRED BY: PROCEDURE DATE:  10/31/2015 PROCEDURE:  EGD w/ biopsy ASA CLASS:     Class III INDICATIONS:  hematemesis and melena, alcohol use with concern for possible cirrhosis. MEDICATIONS: Benadryl 50 mg IV, Fentanyl 100 mcg IV, and Versed 6 mg IV TOPICAL ANESTHETIC: Lidocaine Spray  DESCRIPTION OF PROCEDURE: After the risks benefits and alternatives of the procedure were thoroughly explained, informed consent was obtained.  The Pentax Gastroscope [id number] endoscope was introduced through the mouth and advanced to the second portion of the duodenum , Without limitations.  The instrument was slowly withdrawn as the mucosa was fully examined.   FINDINGS: The esophagus was normal without evidence of esophageal varices.  DH, GEJ, and SCJ located 43cm from the incisors.  A clean based ulcer with a pigmented flat spot was located in the gastric fundus, roughly 7-568mm in diameter.  There was no high risk stigmata for bleeding noted, and no blood noted in the stomach / duodenum. There were some erosions noted in the gastric body / antrum, and a small clean based ulcer at the pylorus (few mm in size).  Biopsies were taken of the gastric body and antrum to rule out H pylori. The duodenal bulb was normal.  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  The remainder of the 2nd portion of the duodenum was normal.  Retroflexed views revealed no abnormalities.     The scope was then withdrawn from the patient and the procedure completed.  COMPLICATIONS: There were no immediate complications.  ENDOSCOPIC IMPRESSION: Normal esophagus Gastric fundus ulcer, clean based with flat pigmented spot, without high risk stigmata for bleeding Multiple  gastric body erosions with small clean based pyloric ulcer Normal duodenum  RECOMMENDATIONS: Return to medical ward Clear liquid diet Continue IV protonix 40mg  BID while hospitalized. Okay to stop octreotide Monitor overnight, advance diet tomorrow as tolerated No NSAIDS, only tylenol for pain Upon discharge patient should have protonix 40mg  BID for 2 weeks then protonix 40mg  daily for another few weeks Await pathology results, consider follow up EGD to ensure healing of ulcer in the next few months Await abdominal US results to assess for cirrhotic changes Tobacco cessation Alcohol detox, cessation    eSigned:  Benancio DeedsSteven P Nigel Ericsson, MD 10/31/2015 3:05 PM    CC: the patient  PATIENT NAME:  Bryan Moyer, Bryan Moyer MR#: [id number]

## 2015-10-31 NOTE — Progress Notes (Signed)
VASCULAR LAB PRELIMINARY  PRELIMINARY  PRELIMINARY  PRELIMINARY  Bilateral lower extremity venous duplex  completed.    Preliminary report:  Bilateral:  No evidence of DVT, superficial thrombosis, or Baker's Cyst.   Clemie General, RVT 10/31/2015, 10:02 AM

## 2015-10-31 NOTE — H&P (View-Only) (Signed)
Consultation  Referring Provider: [facility] Primary Care Physician:  No PCP Per Patient Primary Gastroenterologist:  None/unassigned.  Reason for Consultation:  Gi Bleed  HPI: Bryan Moyer is a 49 y.o. male alcoholic admitted through the emergency room last evening with complaints of 3-4 day history of weakness and black stool. Apparently bout 3 days prior to that he had some episodes of hematemesis. He admits to drinking 6 vodkas per day and has been doing this long-term and also has been taking a lot of Advil recently for leg and foot pain. He has not had any prior episodes of GI bleeding or endoscopic evaluation. He currently denies any abdominal pain. He has been hemodynamically stable and was placed on octreotide and IV PPI infusion on admission. On admit potassium was 2.8 total bili 4.4 alkaline phosphatase 147 and AST of 85. Hemoglobin 9.4 on admission platelets 85, pro time 15.2 INR of 1.18.Marland Kitchen. No hematemesis since admission, he has not been given a definite diagnosis of cirrhosis. Exline Other medical problems include hyperlipidemia, hypertension and depression   Past Medical History  Diagnosis Date  . Hyperlipemia   . Depression with anxiety 03/2014  . Hypertension   . Alcoholic hepatitis 09/2015    Hep A/B/C serologies, HIV all negative 09/2015.   . Tobacco abuse   . Anemia 10/2015    Macrocytic.   . Malnutrition (HCC) 10/2015    moderate, non-severe.   Marland Kitchen. ETOH abuse 03/2014  . Hyperglycemia 09/2015    Past Surgical History  Procedure Laterality Date  . No past surgeries      Prior to Admission medications   Medication Sig Start Date End Date Taking? Authorizing Provider  atorvastatin (LIPITOR) 10 MG tablet Take 1 tablet (10 mg total) by mouth daily at 6 PM. 10/05/15  Yes Maryann Mikhail, DO  feeding supplement (BOOST / RESOURCE BREEZE) LIQD Take 1 Container by mouth every morning. 10/05/15  Yes Maryann Mikhail, DO  FLUoxetine (PROZAC) 20 MG tablet Take 1  tablet (20 mg total) by mouth daily. 09/19/15  Yes Elvina SidleKurt Lauenstein, MD  folic acid (FOLVITE) 1 MG tablet Take 1 tablet (1 mg total) by mouth daily. 10/05/15  Yes Maryann Mikhail, DO  gabapentin (NEURONTIN) 100 MG capsule Take 1 capsule (100 mg total) by mouth 3 (three) times daily. 10/05/15  Yes Maryann Mikhail, DO  Ibuprofen-Diphenhydramine HCl (ADVIL PM) 200-25 MG CAPS Take 2 capsules by mouth at bedtime as needed (sleep/ pain).   Yes Historical Provider, MD  metoprolol tartrate (LOPRESSOR) 25 MG tablet Take 0.5 tablets (12.5 mg total) by mouth 2 (two) times daily. 10/05/15  Yes Maryann Mikhail, DO  Multiple Vitamin (MULTIVITAMIN WITH MINERALS) TABS tablet Take 1 tablet by mouth daily. 10/05/15  Yes Maryann Mikhail, DO  thiamine 100 MG tablet Take 1 tablet (100 mg total) by mouth daily. 10/05/15  Yes Maryann Mikhail, DO  nicotine (NICODERM CQ - DOSED IN MG/24 HOURS) 21 mg/24hr patch Place 1 patch (21 mg total) onto the skin daily. Patient not taking: Reported on 10/30/2015 10/05/15   Edsel PetrinMaryann Mikhail, DO    Current Facility-Administered Medications  Medication Dose Route Frequency Provider Last Rate Last Dose  . cefTRIAXone (ROCEPHIN) 1 g in dextrose 5 % 50 mL IVPB  1 g Intravenous Once Fatmata Legere S Hanadi Stanly, PA-C      . fentaNYL (SUBLIMAZE) injection 25 mcg  25 mcg Intravenous Q3H PRN Eduard ClosArshad N Kakrakandy, MD   25 mcg at 10/31/15 0758  . folic acid (FOLVITE) tablet 1  mg  1 mg Oral Daily Arshad N Kakrakandy, MD   1 mg at 10/30/15 2308  . labetalol (NORMODYNE,TRANDATE) injection 5 mg  5 mg Intravenous Q2H PRN Arshad N Kakrakandy, MD      . LORazepam (ATIVAN) injection 0-4 mg  0-4 mg Intravenous Q6H Arshad N Kakrakandy, MD   2 mg at 10/31/15 0406   Followed by  . [START ON 11/01/2015] LORazepam (ATIVAN) injection 0-4 mg  0-4 mg Intravenous Q12H Arshad N Kakrakandy, MD      . LORazepam (ATIVAN) tablet 1 mg  1 mg Oral Q6H PRN Arshad N Kakrakandy, MD       Or  . LORazepam (ATIVAN) injection 1 mg  1 mg  Intravenous Q6H PRN Arshad N Kakrakandy, MD      . multivitamin with minerals tablet 1 tablet  1 tablet Oral Daily Arshad N Kakrakandy, MD   1 tablet at 10/30/15 2308  . nicotine (NICODERM CQ - dosed in mg/24 hours) patch 21 mg  21 mg Transdermal Once Elizabeth Rees, MD   21 mg at 10/30/15 1814  . octreotide (SANDOSTATIN) 500 mcg in sodium chloride 0.9 % 250 mL (2 mcg/mL) infusion  50 mcg/hr Intravenous Continuous Elizabeth Rees, MD 25 mL/hr at 10/31/15 0407 50 mcg/hr at 10/31/15 0407  . ondansetron (ZOFRAN) tablet 4 mg  4 mg Oral Q6H PRN Arshad N Kakrakandy, MD       Or  . ondansetron (ZOFRAN) injection 4 mg  4 mg Intravenous Q6H PRN Arshad N Kakrakandy, MD      . pantoprazole (PROTONIX) 80 mg in sodium chloride 0.9 % 250 mL (0.32 mg/mL) infusion  8 mg/hr Intravenous Continuous Elizabeth Rees, MD 25 mL/hr at 10/31/15 0907 8 mg/hr at 10/31/15 0907  . [START ON 11/03/2015] pantoprazole (PROTONIX) injection 40 mg  40 mg Intravenous Q12H Elizabeth Rees, MD      . sodium chloride 0.9 % injection 3 mL  3 mL Intravenous Q12H Arshad N Kakrakandy, MD   3 mL at 10/31/15 0258  . thiamine (VITAMIN B-1) tablet 100 mg  100 mg Oral Daily Arshad N Kakrakandy, MD   100 mg at 10/30/15 2308   Or  . thiamine (B-1) injection 100 mg  100 mg Intravenous Daily Arshad N Kakrakandy, MD        Allergies as of 10/30/2015  . (No Known Allergies)    Family History  Problem Relation Age of Onset  . Hyperlipidemia Mother   . Hypertension Mother   . Heart disease Father   . Hyperlipidemia Father   . Stroke Father     Social History   Social History  . Marital Status: Single    Spouse Name: N/A  . Number of Children: N/A  . Years of Education: N/A   Occupational History  . Not on file.   Social History Main Topics  . Smoking status: Current Every Day Smoker -- 2.00 packs/day    Types: Cigarettes  . Smokeless tobacco: Not on file  . Alcohol Use: Yes     Comment: Daily. 4-6 Vodka drinks. Last drink: 18:00    . Drug Use: No  . Sexual Activity: Not on file   Other Topics Concern  . Not on file   Social History Narrative    Review of Systems: Pertinent positive and negative review of systems were noted in the above HPI section.  All other review of systems was otherwise negative.  Physical Exam: Vital signs in last 24 hours: Temp:  [97.8 F (  36.6 C)-98.8 F (37.1 C)] 97.8 F (36.6 C) (12/28 0757) Pulse Rate:  [86-106] 94 (12/28 0700) Resp:  [8-25] 21 (12/28 0700) BP: (79-165)/(59-125) 107/66 mmHg (12/28 0700) SpO2:  [96 %-100 %] 96 % (12/28 0700) Weight:  [151 lb 7.3 oz (68.7 kg)-154 lb (69.854 kg)] 151 lb 7.3 oz (68.7 kg) (12/27 2200) Last BM Date: 10/30/15 General:   Alert,  Well-developed, WM in  pleasant and cooperative in NAD Head:  Normocephalic and atraumatic. Eyes:  Sclera clear, no icterus.   Conjunctiva pink. Ears:  Normal auditory acuity. Nose:  No deformity, discharge,  or lesions. Mouth:  No deformity or lesions.   Neck:  Supple; no masses or thyromegaly. Lungs:  Clear throughout to auscultation.   No wheezes, crackles, or rhonchi. Heart:  Regular rate and rhythm; no murmurs, clicks, rubs,  or gallops. Abdomen:  Soft,nontender, BS active,nonpalp mass or hsm, no definite fluid wave.   Rectal:  Deferred  Msk:  Symmetrical without gross deformities. . Pulses:  Normal pulses noted. Extremities:  Without clubbing or edema. Neurologic:  Alert and  oriented x4;  grossly normal neurologically. Skin:  Intact without significant lesions or rashes.. Psych:  Alert and cooperative. Normal mood and affect.  Intake/Output from previous day: 12/27 0701 - 12/28 0700 In: 4095.4 [I.V.:3260.4; Blood:335; IV Piggyback:500] Out: 100 [Urine:100] Intake/Output this shift:    Lab Results:  Recent Labs  10/30/15 1735 10/31/15 0330  WBC 5.2 4.8  HGB 9.4* 11.3*  HCT 27.7* 34.8*  PLT 95* 89*   BMET  Recent Labs  10/30/15 1735 10/31/15 0330  NA 138 140  K 2.8* 3.4*  CL  102 104  CO2 26 24  GLUCOSE 136* 89  BUN 13 9  CREATININE 0.52* 0.50*  CALCIUM 7.8* 7.8*   LFT  Recent Labs  10/31/15 0330  PROT 5.9*  ALBUMIN 2.4*  AST 87*  ALT 31  ALKPHOS 158*  BILITOT 3.5*   PT/INR  Recent Labs  10/30/15 1735  LABPROT 15.2  INR 1.18     IMPRESSION:   #41 49 year old white male alcoholic presenting with weakness and melena. This is in the setting of heavy EtOH and NSAID use. No prior history of GI bleeding. Will need to rule out peptic ulcer disease, Mallory-Weiss tear, versus variceal bleed #2 hypokalemia-correcting #3 thrombocytopenia, hypoalbuminemia, and elevated LFTs-consistent with alcoholic hepatitis versus cirrhosis #4 history of depression #5 history of hypertension 6 history of hyperlipidemia  Plan; Continue octreotide for now as well as Protonix infusion Have scheduled patient for EGD today with Dr. Sissy Hoff discussed in detail with the patient and he is agreeable to proceed Suspect will have alcohol withdrawal- protocol ordered Check acute hepatitis serologies Schedule for upper abdominal ultrasound in a.m. Tomorrow Stop Zosyn Start Rocephin Further plans pending results of EGD.      Aanya Haynes  10/31/2015, 10:05 AM

## 2015-11-01 ENCOUNTER — Encounter (HOSPITAL_COMMUNITY): Payer: Self-pay | Admitting: Gastroenterology

## 2015-11-01 ENCOUNTER — Inpatient Hospital Stay (HOSPITAL_COMMUNITY): Payer: Self-pay

## 2015-11-01 DIAGNOSIS — K760 Fatty (change of) liver, not elsewhere classified: Secondary | ICD-10-CM

## 2015-11-01 DIAGNOSIS — D62 Acute posthemorrhagic anemia: Secondary | ICD-10-CM

## 2015-11-01 DIAGNOSIS — R161 Splenomegaly, not elsewhere classified: Secondary | ICD-10-CM

## 2015-11-01 DIAGNOSIS — R7989 Other specified abnormal findings of blood chemistry: Secondary | ICD-10-CM

## 2015-11-01 DIAGNOSIS — K7030 Alcoholic cirrhosis of liver without ascites: Secondary | ICD-10-CM

## 2015-11-01 HISTORY — DX: Alcoholic cirrhosis of liver without ascites: K70.30

## 2015-11-01 HISTORY — DX: Fatty (change of) liver, not elsewhere classified: K76.0

## 2015-11-01 HISTORY — DX: Splenomegaly, not elsewhere classified: R16.1

## 2015-11-01 LAB — CBC
HCT: 31.8 % — ABNORMAL LOW (ref 39.0–52.0)
Hemoglobin: 10.6 g/dL — ABNORMAL LOW (ref 13.0–17.0)
MCH: 36.3 pg — ABNORMAL HIGH (ref 26.0–34.0)
MCHC: 33.3 g/dL (ref 30.0–36.0)
MCV: 108.9 fL — ABNORMAL HIGH (ref 78.0–100.0)
PLATELETS: 94 10*3/uL — AB (ref 150–400)
RBC: 2.92 MIL/uL — AB (ref 4.22–5.81)
RDW: 17.1 % — AB (ref 11.5–15.5)
WBC: 4.1 10*3/uL (ref 4.0–10.5)

## 2015-11-01 LAB — COMPREHENSIVE METABOLIC PANEL
ALBUMIN: 2 g/dL — AB (ref 3.5–5.0)
ALT: 28 U/L (ref 17–63)
AST: 84 U/L — AB (ref 15–41)
Alkaline Phosphatase: 131 U/L — ABNORMAL HIGH (ref 38–126)
Anion gap: 10 (ref 5–15)
BUN: 5 mg/dL — AB (ref 6–20)
CHLORIDE: 104 mmol/L (ref 101–111)
CO2: 23 mmol/L (ref 22–32)
Calcium: 7.5 mg/dL — ABNORMAL LOW (ref 8.9–10.3)
Creatinine, Ser: 0.45 mg/dL — ABNORMAL LOW (ref 0.61–1.24)
GFR calc Af Amer: >60 mL/min (ref >60)
GFR calc non Af Amer: >60 mL/min (ref >60)
GLUCOSE: 104 mg/dL — AB (ref 65–99)
POTASSIUM: 3.6 mmol/L (ref 3.5–5.1)
SODIUM: 137 mmol/L (ref 135–145)
Total Bilirubin: 2.3 mg/dL — ABNORMAL HIGH (ref 0.3–1.2)
Total Protein: 5.2 g/dL — ABNORMAL LOW (ref 6.5–8.1)

## 2015-11-01 LAB — HEPATITIS PANEL, ACUTE
HEP B S AG: NEGATIVE
Hep A IgM: NEGATIVE
Hep B C IgM: NEGATIVE

## 2015-11-01 LAB — URINE CULTURE

## 2015-11-01 MED ORDER — PANTOPRAZOLE SODIUM 40 MG IV SOLR
40.0000 mg | Freq: Two times a day (BID) | INTRAVENOUS | Status: DC
  Administered 2015-11-01 – 2015-11-03 (×5): 40 mg via INTRAVENOUS
  Filled 2015-11-01 (×5): qty 40

## 2015-11-01 MED ORDER — PANTOPRAZOLE SODIUM 40 MG PO TBEC: 40.0000 mg | DELAYED_RELEASE_TABLET | Freq: Two times a day (BID) | ORAL | Status: DC

## 2015-11-01 NOTE — Progress Notes (Signed)
Patient ID: Bryan Moyer, male   DOB: 08-17-1966, 49 y.o.   MRN: [id number]    Progress Note   Subjective  Sleepy but arousable- no complaints-no further vomiting ,denies pain,stools looking more normal   Objective   Vital signs in last 24 hours: Temp:  [97.6 F (36.4 C)-98.2 F (36.8 C)] 97.6 F (36.4 C) (12/29 0441) Pulse Rate:  [84-107] 107 (12/29 0441) Resp:  [9-23] 16 (12/29 0441) BP: (95-147)/(60-102) 136/91 mmHg (12/29 0441) SpO2:  [87 %-100 %] 98 % (12/29 0441) Last BM Date: 10/31/15 General:    white male in NAD Heart:  Regular rate and rhythm; no murmurs Lungs: Respirations even and unlabored, lungs CTA bilaterally Abdomen:  Soft, nontender and nondistended. Normal bowel sounds. Extremities:  Without edema. Neurologic:  Alert and oriented,  grossly normal neurologically. Psych:  Cooperative. Normal mood and affect.  Intake/Output from previous day: 12/28 0701 - 12/29 0700 In: 2481.4 [P.O.:1384; I.V.:874.4] Out: 1225 [Urine:1225] Intake/Output this shift:    Lab Results:  Recent Labs  10/30/15 1735 10/31/15 0330 10/31/15 2028 11/01/15 0425  WBC 5.2 4.8  --  4.1  HGB 9.4* 11.3* 13.0 10.6*  HCT 27.7* 34.8* 40.4 31.8*  PLT 95* 89*  --  94*   BMET  Recent Labs  10/30/15 1735 10/31/15 0330 11/01/15 0425  NA 138 140 137  K 2.8* 3.4* 3.6  CL 102 104 104  CO2 26 24 23   GLUCOSE 136* 89 104*  BUN 13 9 5*  CREATININE 0.52* 0.50* 0.45*  CALCIUM 7.8* 7.8* 7.5*   LFT  Recent Labs  11/01/15 0425  PROT 5.2*  ALBUMIN 2.0*  AST 84*  ALT 28  ALKPHOS 131*  BILITOT 2.3*   PT/INR  Recent Labs  10/30/15 1735  LABPROT 15.2  INR 1.18       Assessment / Plan:    #1 49 yo WM alcoholic with acute upper GI bleed-  Clean based gastric ulcer on EGD- likely NSAID and ETOH induced #2 anemia -secondary to acute blood loss #3 elevated LFT's, thrombocytopenia- R/O Cirrhosis- for US this am #4 ETOH withdrawal #5 foot pain- ? Etiology- will need  alternative pain med- no NSAIDS  Plan; US today Advance diet post US BID PPI x 2 weeks then  QD Stop ETOH and NSAIDS      Principal Problem:   Acute GI bleeding Active Problems:   Alcohol abuse   Orthostatic hypotension   Protein-calorie malnutrition, moderate (HCC)   Macrocytosis   Acute blood loss anemia   Foot pain   Hypokalemia   Coagulopathy (HCC)   Thrombocytopenia (HCC)   Elevated LFTs   Gastric ulcer due to nonsteroidal antiinflammatory drug (NSAID) therapy     LOS: 2 days   Amy Esterwood  11/01/2015, 8:39 AM

## 2015-11-01 NOTE — Progress Notes (Signed)
Initial Nutrition Assessment  DOCUMENTATION CODES:   Not applicable  INTERVENTION:  -Diet Advancement per MD -Order Multivitamin   NUTRITION DIAGNOSIS:   Altered GI function related to other (see comment) (ETOH abuse) as evidenced by per patient/family report.  GOAL:   Patient will meet greater than or equal to 90% of their needs  MONITOR:   PO intake, Labs, Diet advancement, I & O's  REASON FOR ASSESSMENT:   Consult Assessment of nutrition requirement/status  ASSESSMENT:   Bryan Moyer is a 49 y.o. male with history of alcohol abuse presents to the ER because of weakness and black stools. 3 days ago patient has had 3-4 episodes of hematemesis. And over the last 24 hours patient has been feeling weak and dizzy and started noticing black stools. Patient drinks every day. Patient also takes Advil. On exam patient also has lower extremity edema with pain in the both greater toes. Hemoglobin has dropped by 2 g from previous and also patient was having orthostatic changes with tachycardia  Attempted to speak with pt at bedside. He was given his Ativan shortly prior to my visit. Per chart, pt has been mixing NSAIDS with EToH and experiencing GI bleeds. Looking at pt, he does not appear to be malnourished, no loss fat or muscle. Until GI function returns to normal - pt will be NPO. W/ diet advancement ONS may be warranted, will ask patient.  Labs and Medications reviewed  Diet Order:  Diet NPO time specified  Skin:  Reviewed, no issues  Last BM:  12/28  Height:   Ht Readings from Last 1 Encounters:  10/30/15 5\' 11"  (1.803 m)    Weight:   Wt Readings from Last 1 Encounters:  10/30/15 151 lb 7.3 oz (68.7 kg)    Ideal Body Weight:  78.18 kg  BMI:  Body mass index is 21.13 kg/(m^2).  Estimated Nutritional Needs:   Kcal:  1700-2000  Protein:  70-80 grams  Fluid:  >/=1.7L  EDUCATION NEEDS:   No education needs identified at this time  Dionne AnoWilliam M. Mckinsey Keagle,  MS, RD LDN After Hours/Weekend Pager [phone]

## 2015-11-01 NOTE — Progress Notes (Signed)
Progress Note   Bryan Moyer [id number] DOB: Jan 29, 1966 DOA: 10/30/2015 PCP: No PCP Per Patient   Brief Narrative:   Bryan Moyer is an 49 y.o. male the PMH of alcohol abuse who was admitted 10/30/15 with a chief complaint of weakness and hematemesis/melena in the setting of NSAID use.  Assessment/Plan:   Principal Problem:   Acute GI bleeding secondary to gastric ulcers with acute blood loss anemia and orthostatic hypotension - Octreotide discontinued 10/31/15. Change Protonix to twice a day dosing. Rocephin discontinued. - Status post EGD showing gastric ulcers. No evidence of varices. Follow-up biopsies. - 1 unit of PRBCs given on admission. - Monitor H and H closely. 2.4 g drop in hemoglobin overnight. - Advance diet to clears.  Active Problems:   Tobacco abuse - Tobacco cessation counseling per nursing staff.    Moderate protein calorie malnutrition - Dietitian consultation. Advance diet to clears.    Alcoholic cirrhosis and fatty liver/coagulopathy/thrombocytopenia/elevated LFTs/splenomegaly - LFT elevation, history of alcoholism, elevated INR and low platelets all suggest alcohol-related liver disease. - Abdominal ultrasound ordered to evaluate for cirrhotic changes: shows fatty liver/increased echogenicity with mild splenomegaly.    Hypokalemia - Resolved with supplementation.    Alcohol abuse - Continue alcohol detoxification per CIWA protocol. CIWA score 3-5. - Social worker consultation for substance abuse counseling requested. - Supplement thiamine and folic acid.    Foot pain - Lower extremity Dopplers negative for DVT. Uric acid 6.8.    DVT Prophylaxis - SCDs ordered.   Family Communication/Anticipated D/C date and plan/Code Status   Family Communication: Declines my offer to call family, none present. Disposition Plan: Home when stable.  Lives with his mother and sister. Anticipated D/C date:   1-2 days if hemoglobin remains  stable. Code Status: Full code.   IV Access:    Peripheral IV   Procedures and diagnostic studies:   No results found.   Medical Consultants:    Gastroenterology  Anti-Infectives:    Zosyn 10/30/15 ---> 10/31/15  Rocephin 10/31/15---> 10/31/15  Subjective:   Bryan Moyer denies N/V/D.   Denies pain.  Last BM was today, stools were normal in color.  No tremors.  Objective:    Filed Vitals:   10/31/15 2100 10/31/15 2115 10/31/15 2157 11/01/15 0441  BP:  147/102 147/98 136/91  Pulse:   88 107  Temp:   98.1 F (36.7 C) 97.6 F (36.4 C)  TempSrc:   Oral Oral  Resp: 18 21 12 16   Height:      Weight:      SpO2:  98% 100% 98%    Intake/Output Summary (Last 24 hours) at 11/01/15 0729 Last data filed at 11/01/15 [id number]  Gross per 24 hour  Intake 2481.35 ml  Output   1225 ml  Net 1256.35 ml   Filed Weights   10/30/15 1612 10/30/15 2200  Weight: 69.854 kg (154 lb) 68.7 kg (151 lb 7.3 oz)    Exam: Gen:  NAD Cardiovascular:  RRR, No M/R/G Respiratory:  Lungs CTAB Gastrointestinal:  Abdomen soft, NT/ND, + BS Extremities:  No C/E/C   Data Reviewed:    Labs: Basic Metabolic Panel:  Recent Labs Lab 10/30/15 1735 10/31/15 0330 11/01/15 0425  NA 138 140 137  K 2.8* 3.4* 3.6  CL 102 104 104  CO2 26 24 23   GLUCOSE 136* 89 104*  BUN 13 9 5*  CREATININE 0.52* 0.50* 0.45*  CALCIUM 7.8* 7.8* 7.5*  MG  --  1.7  --  GFR Estimated Creatinine Clearance: 106.2 mL/min (by C-G formula based on Cr of 0.45). Liver Function Tests:  Recent Labs Lab 10/30/15 1735 10/31/15 0330 11/01/15 0425  AST 85* 87* 84*  ALT ALKPHOS 147* 158* 131*  BILITOT 4.4* 3.5* 2.3*  PROT 5.4* 5.9* 5.2*  ALBUMIN 2.2* 2.4* 2.0*    Recent Labs Lab 10/30/15 1735  LIPASE 19   Coagulation profile  Recent Labs Lab 10/30/15 1735  INR 1.18    CBC:  Recent Labs Lab 10/30/15 1735 10/31/15 0330 10/31/15 2028 11/01/15 0425  WBC 5.2 4.8  --  4.1   NEUTROABS 3.1 2.3  --   --   HGB 9.4* 11.3* 13.0 10.6*  HCT 27.7* 34.8* 40.4 31.8*  MCV 112.6* 109.1*  --  108.9*  PLT 95* 89*  --  94*   CBG:  Recent Labs Lab 10/31/15 0031 10/31/15 0646 10/31/15 1223  GLUCAP 101* 76 82   Sepsis Labs:  Recent Labs Lab 10/30/15 1735 10/30/15 1745 10/31/15 0330 11/01/15 0425  WBC 5.2  --  4.8 4.1  LATICACIDVEN  --  1.75  --   --    Microbiology Recent Results (from the past 240 hour(s))  MRSA PCR Screening     Status: None   Collection Time: 10/31/15 12:06 AM  Result Value Ref Range Status   MRSA by PCR NEGATIVE NEGATIVE Final    Comment:        The GeneXpert MRSA Assay (FDA approved for NASAL specimens only), is one component of a comprehensive MRSA colonization surveillance program. It is not intended to diagnose MRSA infection nor to guide or monitor treatment for MRSA infections.      Medications:   . folic acid  1 mg Oral Daily  . LORazepam  0-4 mg Intravenous Q6H   Followed by  . LORazepam  0-4 mg Intravenous Q12H  . multivitamin with minerals  1 tablet Oral Daily  . nicotine  21 mg Transdermal Daily  . [START ON 11/03/2015] pantoprazole (PROTONIX) IV  40 mg Intravenous Q12H  . sodium chloride  3 mL Intravenous Q12H  . thiamine  100 mg Oral Daily   Or  . thiamine  100 mg Intravenous Daily   Continuous Infusions: . pantoprozole (PROTONIX) infusion 8 mg/hr (11/01/15 1610)    Time spent: 25 minutes.    LOS: 2 days   Kanaan Kagawa  [facility] Pager [phone]. If unable to reach me by pager, please call my cell phone at [phone].  *Please refer to [email or web address], password [email or web address] to get updated schedule on who will round on this patient, as hospitalists switch teams weekly. If 7PM-7AM, please contact night-coverage at [email or web address], password [email or web address] for any overnight needs.  11/01/2015, 7:29 AM

## 2015-11-02 DIAGNOSIS — K7031 Alcoholic cirrhosis of liver with ascites: Secondary | ICD-10-CM

## 2015-11-02 DIAGNOSIS — K254 Chronic or unspecified gastric ulcer with hemorrhage: Principal | ICD-10-CM

## 2015-11-02 LAB — CBC
HCT: 32.9 % — ABNORMAL LOW (ref 39.0–52.0)
HEMOGLOBIN: 10.8 g/dL — AB (ref 13.0–17.0)
MCH: 36 pg — ABNORMAL HIGH (ref 26.0–34.0)
MCHC: 32.8 g/dL (ref 30.0–36.0)
MCV: 109.7 fL — ABNORMAL HIGH (ref 78.0–100.0)
PLATELETS: 108 10*3/uL — AB (ref 150–400)
RBC: 3 MIL/uL — AB (ref 4.22–5.81)
RDW: 16.2 % — ABNORMAL HIGH (ref 11.5–15.5)
WBC: 5 10*3/uL (ref 4.0–10.5)

## 2015-11-02 LAB — IRON AND TIBC
IRON: 52 ug/dL (ref 45–182)
SATURATION RATIOS: 29 % (ref 17.9–39.5)
TIBC: 176 ug/dL — AB (ref 250–450)
UIBC: 124 ug/dL

## 2015-11-02 MED ORDER — CEFTRIAXONE SODIUM 1 G IJ SOLR
1.0000 g | INTRAMUSCULAR | Status: DC
  Administered 2015-11-02 – 2015-11-03 (×2): 1 g via INTRAVENOUS
  Filled 2015-11-02 (×2): qty 10

## 2015-11-02 NOTE — Progress Notes (Signed)
Patient ID: Bryan Moyer, male   DOB: 04-15-1966, 49 y.o.   MRN: [id number]    Progress Note   Subjective   Sleepy but arousable- no complaints  Abd [place]- cirrhosis, mild splenomegaly, and small amt of ascites   Objective   Vital signs in last 24 hours: Temp:  [97.5 F (36.4 C)-98.1 F (36.7 C)] 98.1 F (36.7 C) (12/30 0515) Pulse Rate:  [89-94] 89 (12/30 0515) Resp:  [16-117] 16 (12/30 0515) BP: (119-138)/(68-97) 121/68 mmHg (12/30 0515) SpO2:  [95 %-99 %] 95 % (12/30 0515) Last BM Date: 10/31/15 General:    WM in NAD Heart:  Regular rate and rhythm; no murmurs Lungs: Respirations even and unlabored, lungs CTA bilaterally Abdomen:  Soft, nontender and nondistended. Normal bowel sounds. Extremities:  Without edema. Neurologic:  Alert and oriented,  grossly normal neurologically. Psych:  Cooperative. Normal mood and affect.  Intake/Output from previous day: 12/29 0701 - 12/30 0700 In: 480 [P.O.:480] Out: 3250 [Urine:3250] Intake/Output this shift:    Lab Results:  Recent Labs  10/31/15 0330 10/31/15 2028 11/01/15 0425 11/02/15 0500  WBC 4.8  --  4.1 5.0  HGB 11.3* 13.0 10.6* 10.8*  HCT 34.8* 40.4 31.8* 32.9*  PLT 89*  --  94* 108*   BMET  Recent Labs  10/30/15 1735 10/31/15 0330 11/01/15 0425  NA 138 140 137  K 2.8* 3.4* 3.6  CL 102 104 104  CO2 GLUCOSE 136* 89 104*  BUN 13 9 5*  CREATININE 0.52* 0.50* 0.45*  CALCIUM 7.8* 7.8* 7.5*   LFT  Recent Labs  11/01/15 0425  PROT 5.2*  ALBUMIN 2.0*  AST 84*  ALT 28  ALKPHOS 131*  BILITOT 2.3*   PT/INR  Recent Labs  10/30/15 1735  LABPROT 15.2  INR 1.18    Studies/Results: US Abdomen Complete  11/01/2015  CLINICAL DATA:  Cirrhosis. History of ethanol abuse and hypertension. EXAM: ABDOMEN ULTRASOUND COMPLETE COMPARISON:  None. FINDINGS: Gallbladder: Dependent sludge. No shadowing stones. No wall thickening or evidence of acute cholecystitis. Common bile duct: Diameter: 4.5 mm  Liver: Coarsened echotexture with diffusely increased parenchymal echogenicity and decreased through transmission of the sound beam. No mass or focal lesion. IVC: No abnormality visualized. Pancreas: Limited visualization. Portions visualized are unremarkable. Spleen: Mildly enlarged measuring 12.3 x 4.3 x 13.1 cm with a volume of 360 mL. No splenic mass or focal lesion. Right Kidney: Length: 11.8 cm. Echogenicity within normal limits. No mass or hydronephrosis visualized. Left Kidney: Length: 12.0 cm. Echogenicity within normal limits. No mass or hydronephrosis visualized. Abdominal aorta: No aneurysm visualized. Other findings: Small amount ascites. IMPRESSION: 1. No acute findings.  No evidence acute cholecystitis. 2. Gallbladder sludge without shadowing stones. 3. Coarsened echotexture of the liver with increased echogenicity likely a combination of cirrhosis and fatty infiltration. No liver mass or focal lesion. 4. Mild splenomegaly. 5. Small amount of ascites. Electronically Signed   By: Amie Portland M.D.   On: 11/01/2015 11:46       Assessment / Plan:    #1 49 yo WM alcoholic with acute upper GI bleed secondary to gastric ulcer- clean based, bx benign, and negative for Hpylori- felt secondary to ETOH and NSAIDS #2 anemia - secondary  To blood loss- stable #3 Cirrhosis - secondary to ETOH- Hepatitis serologies negative- decompensated with mild ascites, thrombocytopenia, hypoalbuminemia- no varices #4 ETOH withdrawal  Plan; Continue BID PPI x one month then QD, can convert to oral Stop NSAIDS Initial discussion  with pt regarding dx of Cirrhosis  He has done inpt rehab in past- currently no insurance- will ask social services to see regarding outpt rehab options  Low sodium diet He will need office follow up with Dr Adela LankArmbruster for ongoing management of cirrhosis  Will place back on Rocephin while  inpt - given bleed Bella Kennedy/cirrhosis /ascites  Checking chronic hepatic markers, AFP  From Gi  standpoint he can be discharged soon         Principal Problem:   Acute GI bleeding Active Problems:   Alcohol abuse   Orthostatic hypotension   Protein-calorie malnutrition, moderate (HCC)   Macrocytosis   Acute blood loss anemia   Foot pain   Hypokalemia   Coagulopathy (HCC)   Thrombocytopenia (HCC)   Elevated LFTs   Gastric ulcer due to nonsteroidal antiinflammatory drug (NSAID) therapy   Alcoholic cirrhosis (HCC)   Fatty liver   Splenomegaly     LOS: 3 days   Amy Esterwood  11/02/2015, 9:31 AM

## 2015-11-02 NOTE — Progress Notes (Signed)
Progress Note   Bryan Moyer ZOX:[id number] DOB: 1966/07/21 DOA: 10/30/2015 PCP: No PCP Per Patient   Brief Narrative:   Bryan Moyer is an 49 y.o. male the PMH of alcohol abuse who was admitted 10/30/15 with a chief complaint of weakness and hematemesis/melena in the setting of NSAID use.  Assessment/Plan:   Principal Problem:   Acute GI bleeding secondary to gastric ulcers with acute blood loss anemia and orthostatic hypotension - Octreotide discontinued 10/31/15. Change Protonix to twice a day dosing. Rocephin discontinued. - Status post EGD showing gastric ulcers. No evidence of varices. Follow-up biopsies. - 1 unit of PRBCs given on admission. - Monitor H and H closely. 2.4 g drop in hemoglobin 2 days ago, but hemoglobin stable over the past 24 hours. - Diet advanced.  Active Problems:   Tobacco abuse - Tobacco cessation counseling per nursing staff.    Moderate protein calorie malnutrition - Dietitian consultation. Advance diet to clears.    Alcoholic cirrhosis and fatty liver/coagulopathy/thrombocytopenia/elevated LFTs/splenomegaly - LFT elevation, history of alcoholism, elevated INR and low platelets all suggest alcohol-related liver disease. - Abdominal ultrasound ordered to evaluate for cirrhotic changes: fatty liver/increased echogenicity with mild splenomegaly. - Provided with education/counseling regarding cirrhosis and the need to quit drinking.    Hypokalemia - Resolved with supplementation.    Alcohol abuse - Continue alcohol detoxification per CIWA protocol. CIWA score 3-5. - Social worker consultation for substance abuse counseling requested. - Supplement thiamine and folic acid.    Foot pain - Lower extremity Dopplers negative for DVT. Uric acid 6.8.    DVT Prophylaxis - SCDs ordered.   Family Communication/Anticipated D/C date and plan/Code Status   Family Communication: Declines my offer to call family, none present. Disposition Plan:  Home when stable.  Lives with his mother and sister. Anticipated D/C date:   11/03/15 if hemoglobin remains stable. Code Status: Full code.   IV Access:    Peripheral IV   Procedures and diagnostic studies:   No results found.   Medical Consultants:    Gastroenterology  Anti-Infectives:    Zosyn 10/30/15 ---> 10/31/15  Rocephin 10/31/15---> 10/31/15  Subjective:   Bryan Moyer denies N/V/D. He is  tolerating diet. Denies pain.  No black or bloody stools.  No tremors.  Objective:    Filed Vitals:   11/01/15 1307 11/01/15 2242 11/02/15 0515 11/02/15 1343  BP: 119/74 138/97 121/68 119/71  Pulse: 91 94 89 98  Temp: 97.8 F (36.6 C) 97.5 F (36.4 C) 98.1 F (36.7 C) 97.6 F (36.4 C)  TempSrc: Oral Oral Oral Oral  Resp: 117 Height:      Weight:      SpO2: 98% 99% 95% 98%    Intake/Output Summary (Last 24 hours) at 11/02/15 1456 Last data filed at 11/02/15 0515  Gross per 24 hour  Intake    240 ml  Output   2450 ml  Net  -2210 ml   Filed Weights   10/30/15 1612 10/30/15 2200  Weight: 69.854 kg (154 lb) 68.7 kg (151 lb 7.3 oz)    Exam: Gen:  NAD Cardiovascular:  RRR, No M/R/G Respiratory:  Lungs CTAB Gastrointestinal:  Abdomen soft, NT/ND, + BS Extremities:  No C/E/C   Data Reviewed:    Labs: Basic Metabolic Panel:  Recent Labs Lab 10/30/15 1735 10/31/15 0330 11/01/15 0425  NA 138 140 137  K 2.8* 3.4* 3.6  CL 102 104 104  CO2 26 24  23  GLUCOSE 136* 89 104*  BUN 13 9 5*  CREATININE 0.52* 0.50* 0.45*  CALCIUM 7.8* 7.8* 7.5*  MG  --  1.7  --    GFR Estimated Creatinine Clearance: 106.2 mL/min (by C-G formula based on Cr of 0.45). Liver Function Tests:  Recent Labs Lab 10/30/15 1735 10/31/15 0330 11/01/15 0425  AST 85* 87* 84*  ALT 30 31 28   ALKPHOS 147* 158* 131*  BILITOT 4.4* 3.5* 2.3*  PROT 5.4* 5.9* 5.2*  ALBUMIN 2.2* 2.4* 2.0*    Recent Labs Lab 10/30/15 1735  LIPASE 19   Coagulation  profile  Recent Labs Lab 10/30/15 1735  INR 1.18    CBC:  Recent Labs Lab 10/30/15 1735 10/31/15 0330 10/31/15 2028 11/01/15 0425 11/02/15 0500  WBC 5.2 4.8  --  4.1 5.0  NEUTROABS 3.1 2.3  --   --   --   HGB 9.4* 11.3* 13.0 10.6* 10.8*  HCT 27.7* 34.8* 40.4 31.8* 32.9*  MCV 112.6* 109.1*  --  108.9* 109.7*  PLT 95* 89*  --  94* 108*   CBG:  Recent Labs Lab 10/31/15 0031 10/31/15 0646 10/31/15 1223  GLUCAP 101* 76 82   Sepsis Labs:  Recent Labs Lab 10/30/15 1735 10/30/15 1745 10/31/15 0330 11/01/15 0425 11/02/15 0500  WBC 5.2  --  4.8 4.1 5.0  LATICACIDVEN  --  1.75  --   --   --    Microbiology Recent Results (from the past 240 hour(s))  Urine culture     Status: None   Collection Time: 10/30/15  6:43 PM  Result Value Ref Range Status   Specimen Description URINE, CATHETERIZED  Final   Special Requests NONE  Final   Culture   Final    80,000 COLONIES/ml GROUP B STREP(S.AGALACTIAE)ISOLATED TESTING AGAINST S. AGALACTIAE NOT ROUTINELY PERFORMED DUE TO PREDICTABILITY OF AMP/PEN/VAN SUSCEPTIBILITY. [facility]    Report Status 11/01/2015 FINAL  Final  MRSA PCR Screening     Status: None   Collection Time: 10/31/15 12:06 AM  Result Value Ref Range Status   MRSA by PCR NEGATIVE NEGATIVE Final    Comment:        The GeneXpert MRSA Assay (FDA approved for NASAL specimens only), is one component of a comprehensive MRSA colonization surveillance program. It is not intended to diagnose MRSA infection nor to guide or monitor treatment for MRSA infections.      Medications:   . cefTRIAXone (ROCEPHIN)  IV  1 g Intravenous Q24H  . folic acid  1 mg Oral Daily  . LORazepam  0-4 mg Intravenous Q12H  . multivitamin with minerals  1 tablet Oral Daily  . nicotine  21 mg Transdermal Daily  . pantoprazole (PROTONIX) IV  40 mg Intravenous Q12H  . sodium chloride  3 mL Intravenous Q12H  . thiamine  100 mg Oral Daily   Or  . thiamine   100 mg Intravenous Daily   Continuous Infusions:    Time spent: 25 minutes.    LOS: 3 days   Jillayne Witte  [facility] Pager [phone]. If unable to reach me by pager, please call my cell phone at [phone].  *Please refer to [email or web address], password [email or web address] to get updated schedule on who will round on this patient, as hospitalists switch teams weekly. If 7PM-7AM, please contact night-coverage at [email or web address], password [email or web address] for any overnight needs.  11/02/2015, 2:56 PM

## 2015-11-02 NOTE — Clinical Social Work Note (Signed)
Clinical Social Work Assessment  Patient Details  Name: Bryan Moyer MRN: [id number] Date of Birth: Aug 13, 1966  Date of referral:  11/02/15               Reason for consult:  Substance Use/ETOH Abuse                Permission sought to share information with:   (None) Permission granted to share information::  No  Name::        Agency::     Relationship::     Contact Information:     Housing/Transportation Living arrangements for the past 2 months:  Apartment Source of Information:  Patient Patient Interpreter Needed:  None Criminal Activity/Legal Involvement Pertinent to Current Situation/Hospitalization:  No - Comment as needed Significant Relationships:  Parents, Friend Lives with:    Do you feel safe going back to the place where you live?  Yes Need for family participation in patient care:  No (Coment)  Care giving concerns:  Pt has concerns about his health and had multiple questions about recovery.   Social Worker assessment / plan:  CSW introduced self to Pt and gave an explanation for assessment. Pt was receptive to assessment and receiving information. Pt reports chronic use of alcohol for over three years. Pt states that he began drinking before he lost his job. Pt does not report any use of illegal drugs. Pt states he drinks daily. Pt drinks 5-6 12 oz glasses (1/2 sprite  1/2 vodka) per day seven days per week. Pt does not admit to losing consciousness nor blackouts. Pt stated his drinking "really picked up after I lost my job over a year ago." Pt denies any further employment after his last job. Pt is interested recovery options, however Pt is concerned about cost. CSW recommended Pt speak with several recovery agencies to enquire about payment options and other assistance. Pt was receptive to information. CSW also provided Pt with [facility] resources and recommended Pt connect with local AA meetings for support with his recovery. Pt did ask many questions about liver  diseases related to drinking and CSW referred him to his medical professionals and recovery programs.  Employment status:  Unemployed Health and safety inspectornsurance information:  Self Pay (Medicaid Pending) PT Recommendations:  Not assessed at this time Information / Referral to community resources:  Outpatient Psychiatric Care (Comment Required), Residential Substance Abuse Treatment Options, Support Groups (CSW gave handouts for resources for substance abuse and mental health)  Patient/Family's Response to care:  Pt was appreciative for information and requested CSW services contact information to get in touch with more services if needed.   Patient/Family's Understanding of and Emotional Response to Diagnosis, Current Treatment, and Prognosis:  Pt shows concerns and has interest in recovery   Emotional Assessment Appearance:  Appears older than stated age, Disheveled Attitude/Demeanor/Rapport:  Suspicious, Guarded Affect (typically observed):  Accepting, Pleasant Orientation:  Oriented to Self, Oriented to Place, Oriented to  Time, Oriented to Situation Alcohol / Substance use:  Tobacco Use, Alcohol Use Psych involvement (Current and /or in the community):  No (Comment)  Discharge Needs  Concerns to be addressed:  Substance Abuse Concerns, Adjustment to Illness Readmission within the last 30 days:  Yes Current discharge risk:  Substance Abuse Barriers to Discharge:  Continued Medical Work up   Se[employer] Air CorporationCassandra Moyer Due 11/02/2015, 5:15 PM

## 2015-11-03 LAB — TYPE AND SCREEN
ABO/RH(D): O POS
ANTIBODY SCREEN: NEGATIVE
UNIT DIVISION: 0
Unit division: 0

## 2015-11-03 LAB — CERULOPLASMIN: Ceruloplasmin: 21.8 mg/dL (ref 16.0–31.0)

## 2015-11-03 LAB — CBC
HEMATOCRIT: 32.7 % — AB (ref 39.0–52.0)
Hemoglobin: 10.8 g/dL — ABNORMAL LOW (ref 13.0–17.0)
MCH: 36 pg — AB (ref 26.0–34.0)
MCHC: 33 g/dL (ref 30.0–36.0)
MCV: 109 fL — ABNORMAL HIGH (ref 78.0–100.0)
Platelets: 112 10*3/uL — ABNORMAL LOW (ref 150–400)
RBC: 3 MIL/uL — ABNORMAL LOW (ref 4.22–5.81)
RDW: 16 % — AB (ref 11.5–15.5)
WBC: 4.8 10*3/uL (ref 4.0–10.5)

## 2015-11-03 LAB — AFP TUMOR MARKER: AFP TUMOR MARKER: 3.5 ng/mL (ref 0.0–8.3)

## 2015-11-03 LAB — FERRITIN: FERRITIN: 1000 ng/mL — AB (ref 24–336)

## 2015-11-03 MED ORDER — PANTOPRAZOLE SODIUM 40 MG PO TBEC: 40.0000 mg | DELAYED_RELEASE_TABLET | Freq: Two times a day (BID) | ORAL | Status: AC

## 2015-11-03 NOTE — Discharge Summary (Signed)
Physician Discharge Summary  Bryan Moyer NFA:[id number] DOB: 08-03-1966 DOA: 10/30/2015  PCP: No PCP Per Patient  Admit date: 10/30/2015 Discharge date: 11/03/2015   Recommendations for Outpatient Follow-Up:   1. Follow-up with gastroenterologist in 4-6 weeks. 2. Given information regarding community resources for alcohol counseling/treatment. 3. Given resources for establishment of care at the [facility].   Discharge Diagnosis:   Principal Problem:    Acute GI bleeding secondary to gastric ulcers Active Problems:    Alcohol abuse    Orthostatic hypotension    Protein-calorie malnutrition, moderate (HCC)    Macrocytosis    Acute blood loss anemia    Foot pain    Hypokalemia    Coagulopathy (HCC)    Thrombocytopenia (HCC)    Elevated LFTs    Gastric ulcer due to nonsteroidal antiinflammatory drug (NSAID) therapy    Alcoholic cirrhosis (HCC)    Fatty liver    Splenomegaly   Discharge disposition:  Home.    Discharge Condition: Improved.  Diet recommendation: Regular.   History of Present Illness:   Bryan Moyer is an 49 y.o. male the PMH of alcohol abuse who was admitted 10/30/15 with a chief complaint of weakness and hematemesis/melena in the setting of NSAID use.  Hospital Course by Problem:   Principal Problem:  Acute GI bleeding secondary to gastric ulcers with acute blood loss anemia and orthostatic hypotension - Octreotide discontinued 10/31/15.continue PPI at discharge. - Status post EGD showing gastribiopsies negative for malignancy/H. Pylori. - 1 unit of PRBCs given on admission. - H&H stable 48 hours. Diet successfully advanced.   Active Problems:  Tobacco abuse - Tobacco cessation counseling provided by nursing staff.   Moderate protein calorie malnutrition - Counseled by dietitian 11/01/15.    Alcoholic cirrhosis and fatty liver/coagulopathy/thrombocytopenia/elevated LFTs/splenomegaly - LFT elevation, history of  alcoholism, elevated INR and low platelets all suggest alcohol-related liver disease. - Abdominal ultrasound confirmed cirrhotic changes: fatty liver/increased echogenicity with mild splenomegaly. - Provided with education/counseling regarding cirrhosis and the need to quit drinking.   Hypokalemia - Resolved with supplementation.   Alcohol abuse - S/P alcohol detoxification per CIWA protocol.  - Social worker provided substance abuse counseling and information regarding community resources. - Supplement thiamine and folic acid.   Foot pain - Lower extremity Dopplers negative for DVT. Uric acid 6.8. 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   Medical Consultants:    Gastroenterology: Ruffin Frederick, MD   Discharge Exam:   Filed Vitals:   11/02/15 2211 11/03/15 0442  BP: 140/93 131/83  Pulse: 101 110  Temp: 98.4 F (36.9 C) 98.7 F (37.1 C)  Resp: 18 17   Filed Vitals:   11/02/15 1343 11/02/15 2132 11/02/15 2211 11/03/15 0442  BP: 119/71 151/105 140/93 131/83  Pulse: 98 97 101 110  Temp: 97.6 F (36.4 C)  98.4 F (36.9 C) 98.7 F (37.1 C)  TempSrc: Oral  Oral Oral  Resp: 18  18 17   Height:      Weight:      SpO2: 98%  100% 97%    Gen:  NAD Cardiovascular:  RRR, No M/R/G Respiratory: Lungs CTAB Gastrointestinal: Abdomen soft, NT/ND with normal active bowel sounds. Extremities: No C/E/C   The results of significant diagnostics from this hospitalization (including imaging, microbiology, ancillary and laboratory) are listed below for reference.     Procedures and Diagnostic Studies:   EGD 10/31/15  ENDOSCOPIC IMPRESSION: Normal esophagus Gastric fundus ulcer, clean based with flat pigmented spot, without  high risk stigmata for bleeding Multiple gastric body erosions with small clean based pyloric ulcer Normal duodenum   Labs:   Basic Metabolic Panel:  Recent Labs Lab 10/30/15 1735 10/31/15 0330 11/01/15 0425  NA 138 140  137  K 2.8* 3.4* 3.6  CL 102 104 104  CO2 26 24 23   GLUCOSE 136* 89 104*  BUN 13 9 5*  CREATININE 0.52* 0.50* 0.45*  CALCIUM 7.8* 7.8* 7.5*  MG  --  1.7  --    GFR Estimated Creatinine Clearance: 106.2 mL/min (by C-G formula based on Cr of 0.45). Liver Function Tests:  Recent Labs Lab 10/30/15 1735 10/31/15 0330 11/01/15 0425  AST 85* 87* 84*  ALT 30 31 28   ALKPHOS 147* 158* 131*  BILITOT 4.4* 3.5* 2.3*  PROT 5.4* 5.9* 5.2*  ALBUMIN 2.2* 2.4* 2.0*    Recent Labs Lab 10/30/15 1735  LIPASE 19   Coagulation profile  Recent Labs Lab 10/30/15 1735  INR 1.18    CBC:  Recent Labs Lab 10/30/15 1735 10/31/15 0330 10/31/15 2028 11/01/15 0425 11/02/15 0500 11/03/15 0435  WBC 5.2 4.8  --  4.1 5.0 4.8  NEUTROABS 3.1 2.3  --   --   --   --   HGB 9.4* 11.3* 13.0 10.6* 10.8* 10.8*  HCT 27.7* 34.8* 40.4 31.8* 32.9* 32.7*  MCV 112.6* 109.1*  --  108.9* 109.7* 109.0*  PLT 95* 89*  --  94* 108* 112*   CBG:  Recent Labs Lab 10/31/15 0031 10/31/15 0646 10/31/15 1223  GLUCAP 101* 76 82   Anemia work up  Recent Labs  11/02/15 1122 11/03/15 0435  FERRITIN  --  1000*  TIBC 176*  --   IRON 52  --    Microbiology Recent Results (from the past 240 hour(s))  Urine culture     Status: None   Collection Time: 10/30/15  6:43 PM  Result Value Ref Range Status   Specimen Description URINE, CATHETERIZED  Final   Special Requests NONE  Final   Culture   Final    80,000 COLONIES/ml GROUP B STREP(S.AGALACTIAE)ISOLATED TESTING AGAINST S. AGALACTIAE NOT ROUTINELY PERFORMED DUE TO PREDICTABILITY OF AMP/PEN/VAN SUSCEPTIBILITY. [facility]    Report Status 11/01/2015 FINAL  Final  MRSA PCR Screening     Status: None   Collection Time: 10/31/15 12:06 AM  Result Value Ref Range Status   MRSA by PCR NEGATIVE NEGATIVE Final    Comment:        The GeneXpert MRSA Assay (FDA approved for NASAL specimens only), is one component of a comprehensive MRSA  colonization surveillance program. It is not intended to diagnose MRSA infection nor to guide or monitor treatment for MRSA infections.      Discharge Instructions:   Discharge Instructions    Call MD for:  extreme fatigue    Complete by:  As directed      Call MD for:  persistant dizziness or light-headedness    Complete by:  As directed      Call MD for:  persistant nausea and vomiting    Complete by:  As directed      Call MD for:  severe uncontrolled pain    Complete by:  As directed      Call MD for:    Complete by:  As directed   Black or bloody stools.     Diet - low sodium heart healthy    Complete by:  As directed  Discharge instructions    Complete by:  As directed   Take protonix  twice a day for 2 weeks and then once daily thereafter. Follow up with gastroenterologist in 1-2 months and plan for a repeat EGD to ensure interval healing of the ulcer. Avoid all over-the-counter pain relievers except for Tylenol.   It is very important that you discontinue all alcohol use to prevent further liver damage.     Increase activity slowly    Complete by:  As directed             Medication List    STOP taking these medications        ADVIL PM 200-25 MG Caps  Generic drug:  Ibuprofen-Diphenhydramine HCl     gabapentin 100 MG capsule  Commonly known as:  NEURONTIN      TAKE these medications        atorvastatin 10 MG tablet  Commonly known as:  LIPITOR  Take 1 tablet (10 mg total) by mouth daily at 6 PM.     feeding supplement Liqd  Take 1 Container by mouth every morning.     FLUoxetine 20 MG tablet  Commonly known as:  PROZAC  Take 1 tablet (20 mg total) by mouth daily.     folic acid 1 MG tablet  Commonly known as:  FOLVITE  Take 1 tablet (1 mg total) by mouth daily.     metoprolol tartrate 25 MG tablet  Commonly known as:  LOPRESSOR  Take 0.5 tablets (12.5 mg total) by mouth 2 (two) times daily.     multivitamin with minerals Tabs tablet   Take 1 tablet by mouth daily.     nicotine 21 mg/24hr patch  Commonly known as:  NICODERM CQ - dosed in mg/24 hours  Place 1 patch (21 mg total) onto the skin daily.     pantoprazole 40 MG tablet  Commonly known as:  PROTONIX  Take 1 tablet (40 mg total) by mouth 2 (two) times daily.     thiamine 100 MG tablet  Take 1 tablet (100 mg total) by mouth daily.           Follow-up Information    Follow up with Jaclyn Shaggy, MD. Schedule an appointment as soon as possible for a visit on 11/06/2015.   Specialty:  Family Medicine   Why:  Follow up by calling the [place] and wellness doctor as [facility] instructed you to reschedule your previously scheduled 10/17/15 appt with Dr Carleene Overlie information:   [address] [place] [address] [phone]       Follow up with Please use the resources provided to you in emergency room by case manager to assist with doctor for follow up if unable to obtain an appointment with [facility] . Call on 11/06/2015.   Why:  A referral for you has been sent to [facility] if you have not received a call in 3 days you may contact them Call Scherry Ran at [phone] Tuesday-Friday [email or web address]   Contact information:   These [place] uninsured resources provide possible primary care providers, resources for discounted medications, housing, dental resources, affordable care act information, plus other resources for [employer]        Follow up with [facility]. Schedule an appointment as soon as possible for a visit in 3 weeks.   Why:  Hospital follow up.   Contact information:   [address] [place] [address] [phone]       Time coordinating discharge: 35 minutes.  Signed:  Aurie Harroun  Pager [phone] [facility] 11/03/2015, 9:48 AM

## 2015-11-03 NOTE — Discharge Instructions (Signed)
Cirrhosis °Cirrhosis is long-term (chronic) liver injury. The liver is your largest internal organ, and it performs many functions. The liver converts food into energy, removes toxic material from your blood, makes important proteins, and absorbs necessary vitamins from your diet. °If you have cirrhosis, it means many of your healthy liver cells have been replaced by scar tissue. This prevents blood from flowing through your liver, which makes it difficult for your liver to function. This scarring is not reversible, but treatment can prevent it from getting worse.  °CAUSES  °Hepatitis C and long-term alcohol abuse are the most common causes of cirrhosis. Other causes include: °· Nonalcoholic fatty liver disease. °· Hepatitis B infection. °· Autoimmune hepatitis. °· Diseases that cause blockage of ducts inside the liver. °· Inherited liver diseases. °· Reactions to certain long-term medicines. °· Parasitic infections. °· Long-term exposure to certain toxins. °RISK FACTORS °You may have a higher risk of cirrhosis if you: °· Have certain hepatitis viruses. °· Abuse alcohol, especially if you are male. °· Are overweight. °· Share needles. °· Have unprotected sex with someone who has hepatitis. °SYMPTOMS  °You may not have any signs and symptoms at first. Symptoms may not develop until the damage to your liver starts to get worse. Signs and symptoms of cirrhosis may include:  °· Tenderness in the right-upper part of your abdomen. °· Weakness and tiredness (fatigue). °· Loss of appetite. °· Nausea. °· Weight loss and muscle loss. °· Itchiness. °· Yellow skin and eyes (jaundice). °· Buildup of fluid in the abdomen (ascites). °· Swelling of the feet and ankles (edema). °· Appearance of tiny blood vessels under the skin. °· Mental confusion. °· Easy bruising and bleeding. °DIAGNOSIS  °Your health care provider may suspect cirrhosis based on your symptoms and medical history, especially if you have other medical conditions  or a history of alcohol abuse. Your health care provider will do a physical exam to feel your liver and check for signs of cirrhosis. Your health care provider may perform other tests, including:  °· Blood tests to check:   °¨ Whether you have hepatitis B or C.   °¨ Kidney function. °¨ Liver function. °· Imaging tests such as: °¨ MRI or CT scan to look for changes seen in advanced cirrhosis. °¨ Ultrasound to see if normal liver tissue is being replaced by scar tissue. °· A procedure using a long needle to take a sample of liver tissue (biopsy) for examination under a microscope. Liver biopsy can confirm the diagnosis of cirrhosis.   °TREATMENT  °Treatment depends on how damaged your liver is and what caused the damage. Treatment may include treating cirrhosis symptoms or treating the underlying causes of the condition to try to slow the progression of the damage. Treatment may include: °· Making lifestyle changes, such as:   °¨ Eating a healthy diet. °¨ Restricting salt intake.  °¨ Maintaining a healthy weight.   °¨ Not abusing drugs or alcohol. °· Taking medicines to: °¨ Treat liver infections or other infections. °¨ Control itching. °¨ Reduce fluid buildup. °¨ Reduce certain blood toxins. °¨ Reduce risk of bleeding from enlarged blood vessels in the stomach or esophagus (varices). °· If varices are causing bleeding problems, you may need treatment with a procedure that ties up the vessels causing them to fall off (band ligation). °· If cirrhosis is causing your liver to fail, your health care provider may recommend a liver transplant. °· Other treatments may be recommended depending on any complications of cirrhosis, such as liver-related kidney failure (hepatorenal   syndrome). °HOME CARE INSTRUCTIONS  °· Take medicines only as directed by your health care provider. Do not use drugs that are toxic to your liver. Ask your health care provider before taking any new medicines, including over-the-counter medicines.    °· Rest as needed. °· Eat a well-balanced diet. Ask your health care provider or dietitian for more information.   °· You may have to follow a low-salt diet or restrict your water intake as directed. °· Do not drink alcohol. This is especially important if you are taking acetaminophen. °· Keep all follow-up visits as directed by your health care provider. This is important. °SEEK MEDICAL CARE IF: °· You have fatigue or weakness that is getting worse. °· You develop swelling of the hands, feet, legs, or face. °· You have a fever. °· You develop loss of appetite. °· You have nausea or vomiting. °· You develop jaundice. °· You develop easy bruising or bleeding. °SEEK IMMEDIATE MEDICAL CARE IF: °· You vomit bright red blood or a material that looks like coffee grounds. °· You have blood in your stools. °· Your stools appear black and tarry. °· You become confused. °· You have chest pain or trouble breathing. °  °This information is not intended to replace advice given to you by your health care provider. Make sure you discuss any questions you have with your health care provider. °  °Document Released: 10/20/2005 Document Revised: 11/10/2014 Document Reviewed: 06/28/2014 °Elsevier Interactive Patient Education ©2016 [employer] ° °

## 2015-11-04 LAB — ANTI-SMOOTH MUSCLE ANTIBODY, IGG: F-Actin IgG: 9 Units (ref 0–19)

## 2015-11-04 LAB — MITOCHONDRIAL ANTIBODIES: MITOCHONDRIAL M2 AB, IGG: 5.1 U (ref 0.0–20.0)

## 2015-11-06 ENCOUNTER — Telehealth: Payer: Self-pay | Admitting: *Deleted

## 2015-11-06 LAB — ALPHA-1 ANTITRYPSIN PHENOTYPE: A-1 Antitrypsin, Ser: 166 mg/dL (ref 90–200)

## 2015-11-06 LAB — ANTINUCLEAR ANTIBODIES, IFA: ANA Ab, IFA: NEGATIVE

## 2015-11-06 NOTE — Telephone Encounter (Signed)
Left a message for patient to call back. 

## 2015-11-06 NOTE — Telephone Encounter (Signed)
-----   Message from Sammuel CooperAmy S Esterwood, PA-C sent at 11/02/2015 10:16 AM EST ----- Regarding: office appt [facility] cirrhosis and GI bleed from gastric ulcer- please call him next week with a follow up office appt with Dr Adela LankArmbruster   First  Available- 4-5 weeks is fine

## 2015-11-06 NOTE — Telephone Encounter (Signed)
Can you book him with me in February, and then maybe he can see one of the APPs sometime before the end of the month to ensure he is stable. He does not have insurance and not sure if he will be compliant with follow ups but it is critical given his diagnosis of cirrhosis.

## 2015-11-06 NOTE — Telephone Encounter (Signed)
Dr. Adela LankArmbruster, The earliest appointment I have without overbooking you is 01/01/16. Is this to far out for this patient? Should he see an APP?

## 2015-11-07 ENCOUNTER — Other Ambulatory Visit: Payer: Self-pay

## 2015-11-07 NOTE — Telephone Encounter (Signed)
Left a message for patient to call back. 

## 2015-11-08 ENCOUNTER — Encounter: Payer: Self-pay | Admitting: *Deleted

## 2015-11-08 NOTE — Telephone Encounter (Signed)
Scheduled an OV with Mike GipAmy Esterwood, PA on 11/27/15 at 1:30 PM and an OV with Dr. Adela LankArmbruster on 01/01/16 at 1:45 PM. Mailed patient a letter with appointments.

## 2015-11-08 NOTE — Telephone Encounter (Signed)
Left a message for patient to call back. 

## 2015-11-27 ENCOUNTER — Ambulatory Visit: Payer: Self-pay | Admitting: Physician Assistant

## 2015-11-27 ENCOUNTER — Other Ambulatory Visit: Payer: Self-pay | Admitting: Family Medicine

## 2016-01-01 ENCOUNTER — Ambulatory Visit: Payer: Self-pay | Admitting: Gastroenterology

## 2016-01-02 DEATH — deceased

## 2016-06-30 IMAGING — US US ABDOMEN COMPLETE
1 series · 13 of 25 positions shown · non-contrast
Comparison: None.

CLINICAL DATA: Cirrhosis. History of ethanol abuse and
hypertension.

EXAM:
ABDOMEN ULTRASOUND COMPLETE

[Series 1: us abdomen complete · 0.24mm/px · 13 of 196 slices shown]
[im 1/196]
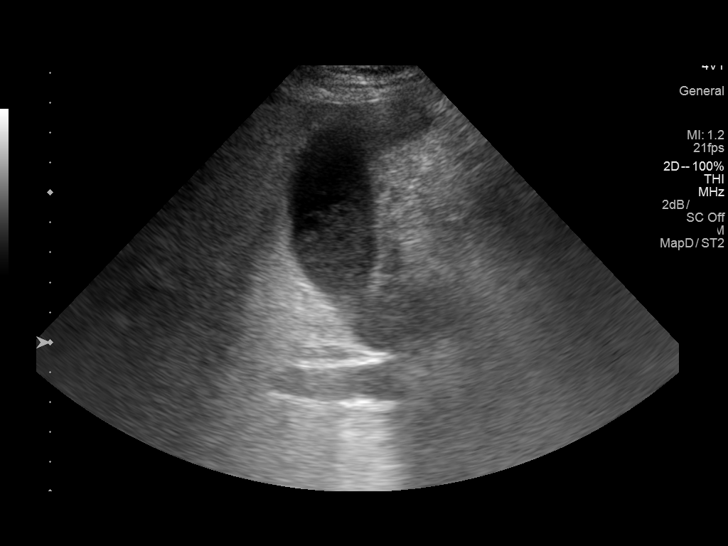
[im 17/196]
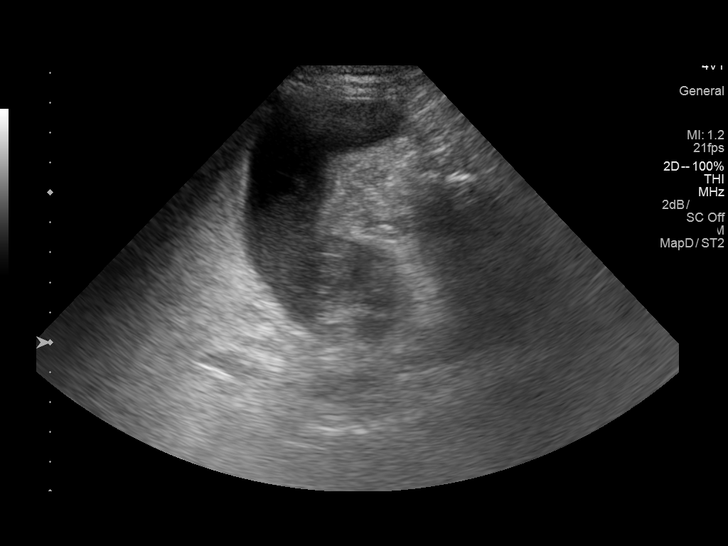
[im 33/196]
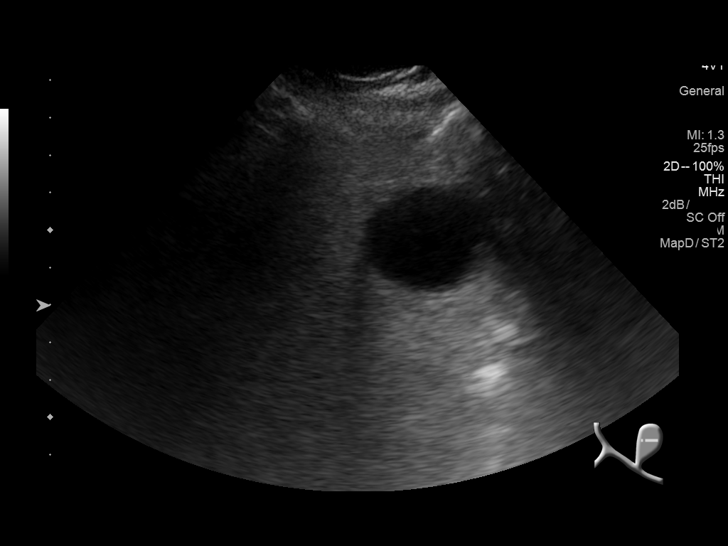
[im 49/196]
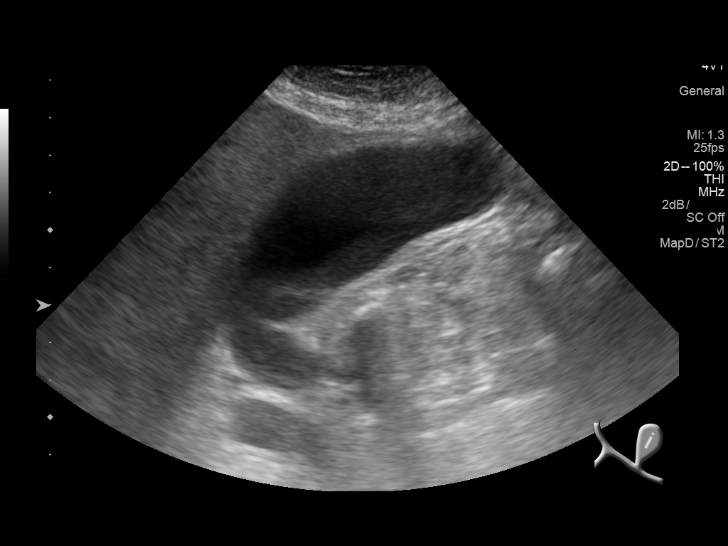
[im 66/196]
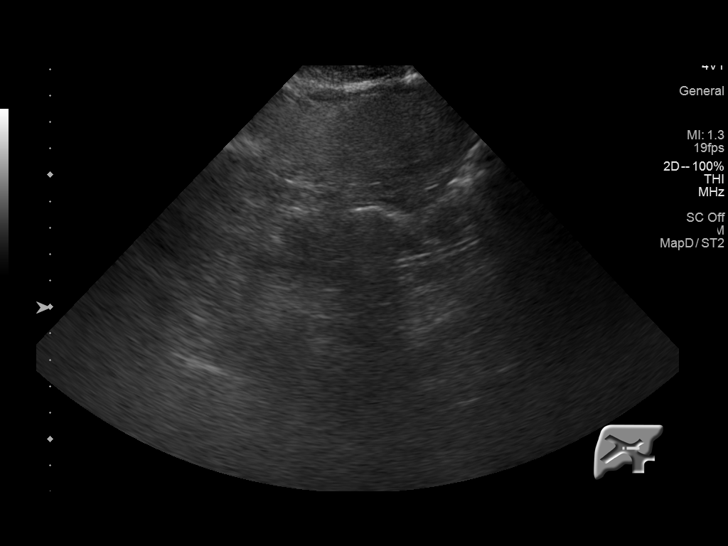
[im 82/196]
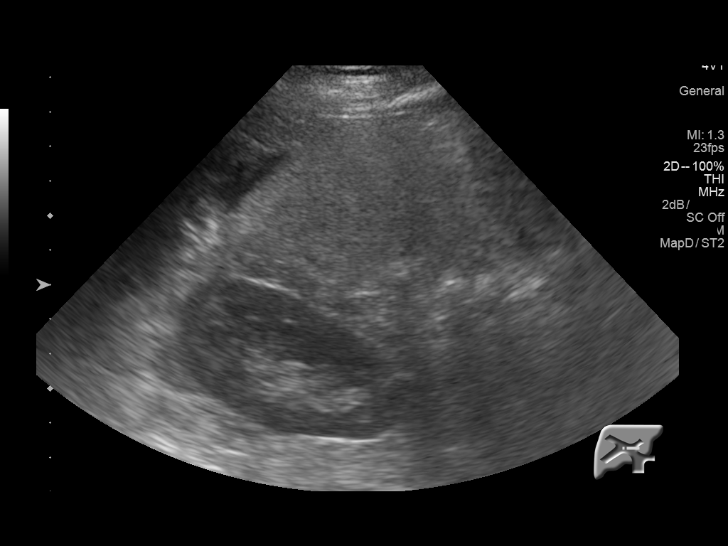
[im 98/196]
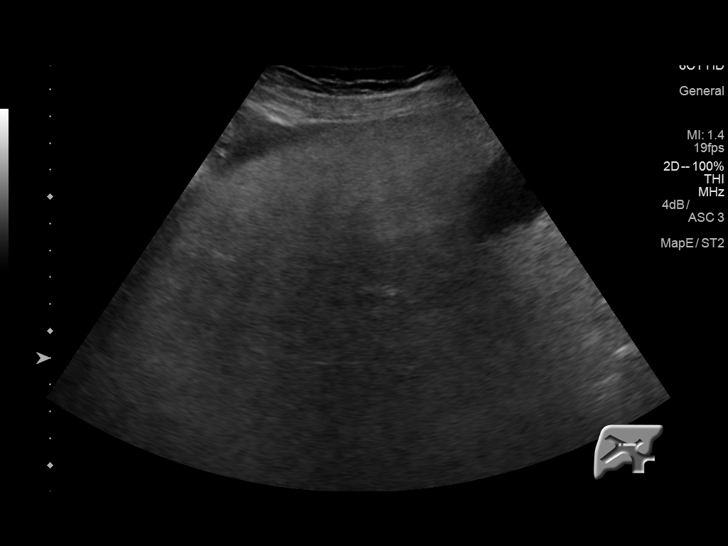
[im 114/196]
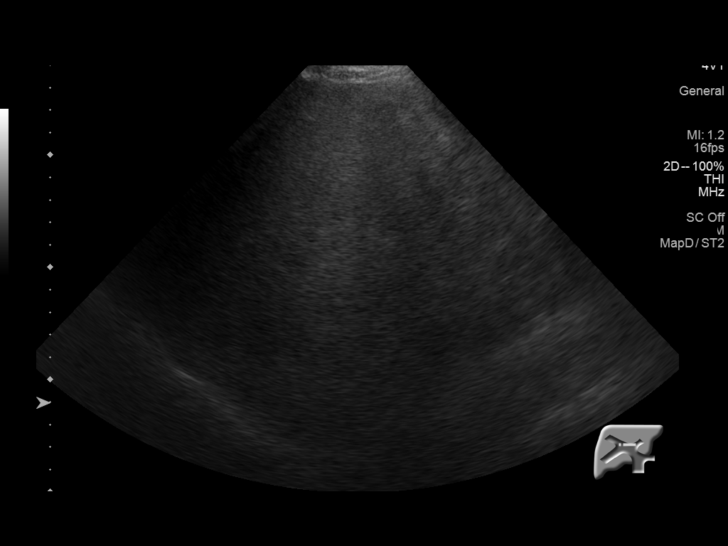
[im 131/196]
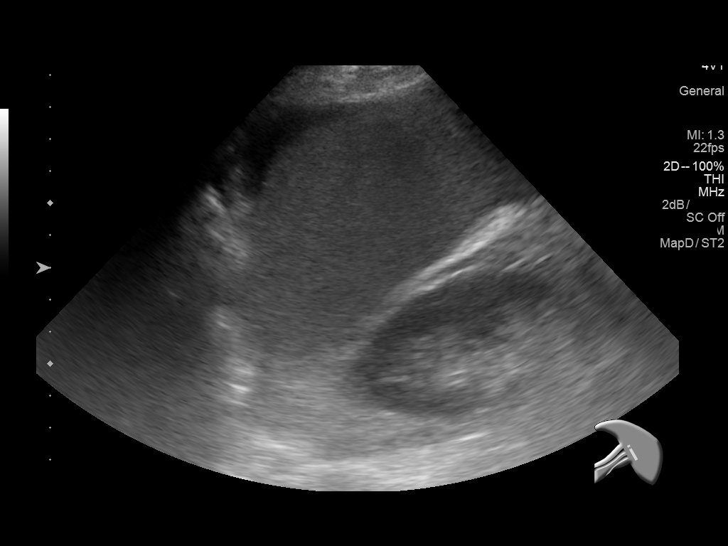
[im 147/196]
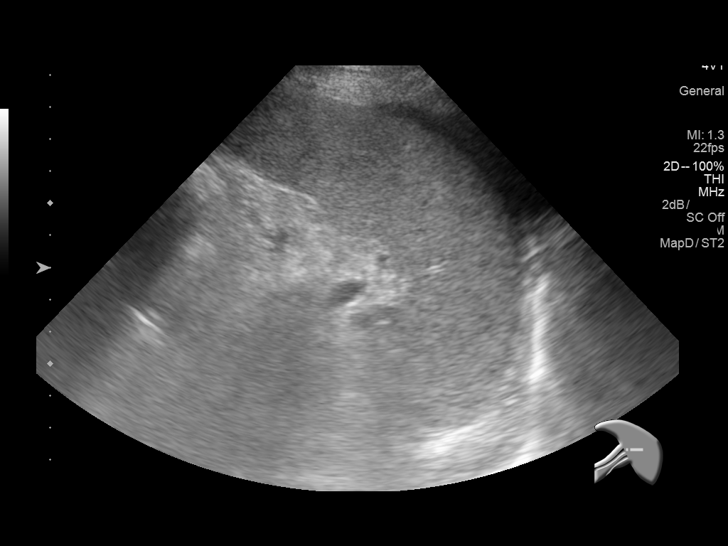
[im 163/196]
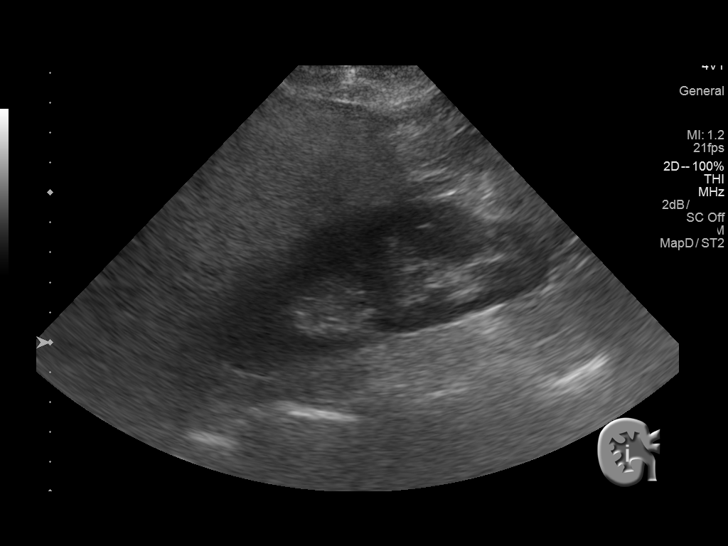
[im 179/196]
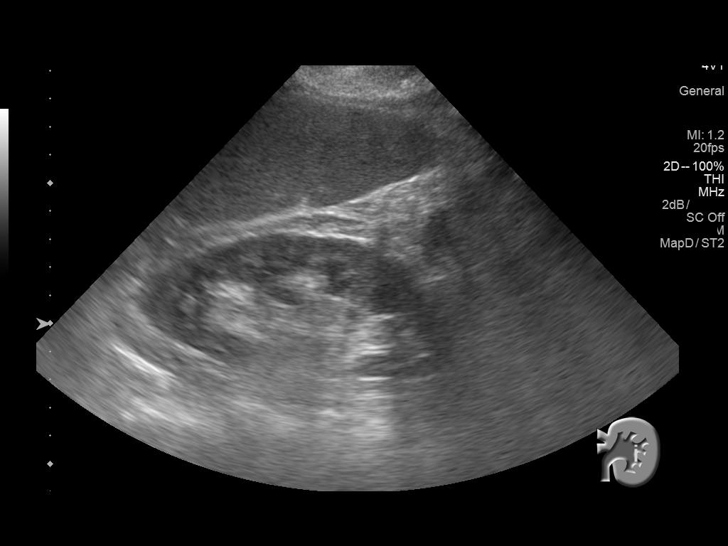
[im 196/196]
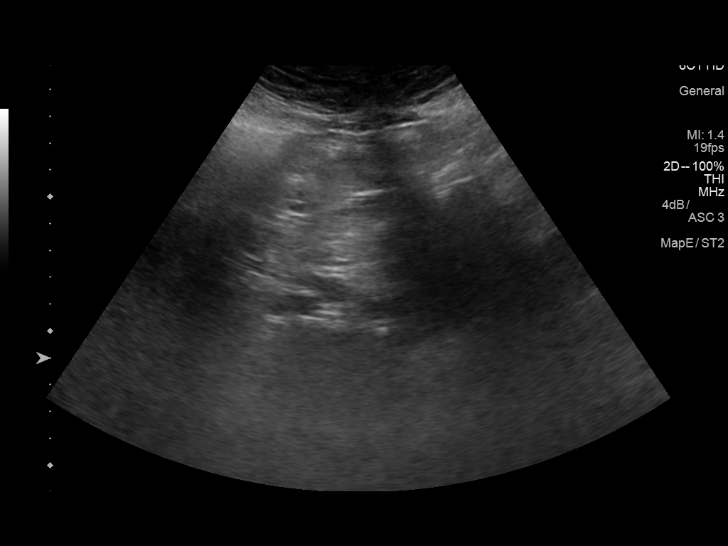

[13 of 25 positions shown; findings below may reference images not displayed]

FINDINGS: Gallbladder: Dependent sludge. No shadowing stones. No wall
thickening or evidence of acute cholecystitis.

Common bile duct: Diameter: 4.5 mm

Liver: Coarsened echotexture with diffusely increased parenchymal
echogenicity and decreased through transmission of the sound beam.
No mass or focal lesion.

IVC: No abnormality visualized.

Pancreas: Limited visualization. Portions visualized are
unremarkable.

Spleen: Mildly enlarged measuring 12.3 x 4.3 x 13.1 cm with a volume
of 360 mL. No splenic mass or focal lesion.

Right Kidney: Length: 11.8 cm. Echogenicity within normal limits. No
mass or hydronephrosis visualized.

Left Kidney: Length: 12.0 cm. Echogenicity within normal limits. No
mass or hydronephrosis visualized.

Abdominal aorta: No aneurysm visualized.

Other findings: Small amount ascites.
IMPRESSION: 1. No acute findings.  No evidence acute cholecystitis.
2. Gallbladder sludge without shadowing stones.
3. Coarsened echotexture of the liver with increased echogenicity
likely a combination of cirrhosis and fatty infiltration. No liver
mass or focal lesion.
4. Mild splenomegaly.
5. Small amount of ascites.

## 2016-10-27 IMAGING — CR DG CHEST 2V
2 series · 2 of 2 positions shown · non-contrast
Comparison: None.

CLINICAL DATA: 49-year-old male with dizziness for several months.
Cough for several days. Fall this a.m..

EXAM:
CHEST  2 VIEW

[w chest pa]
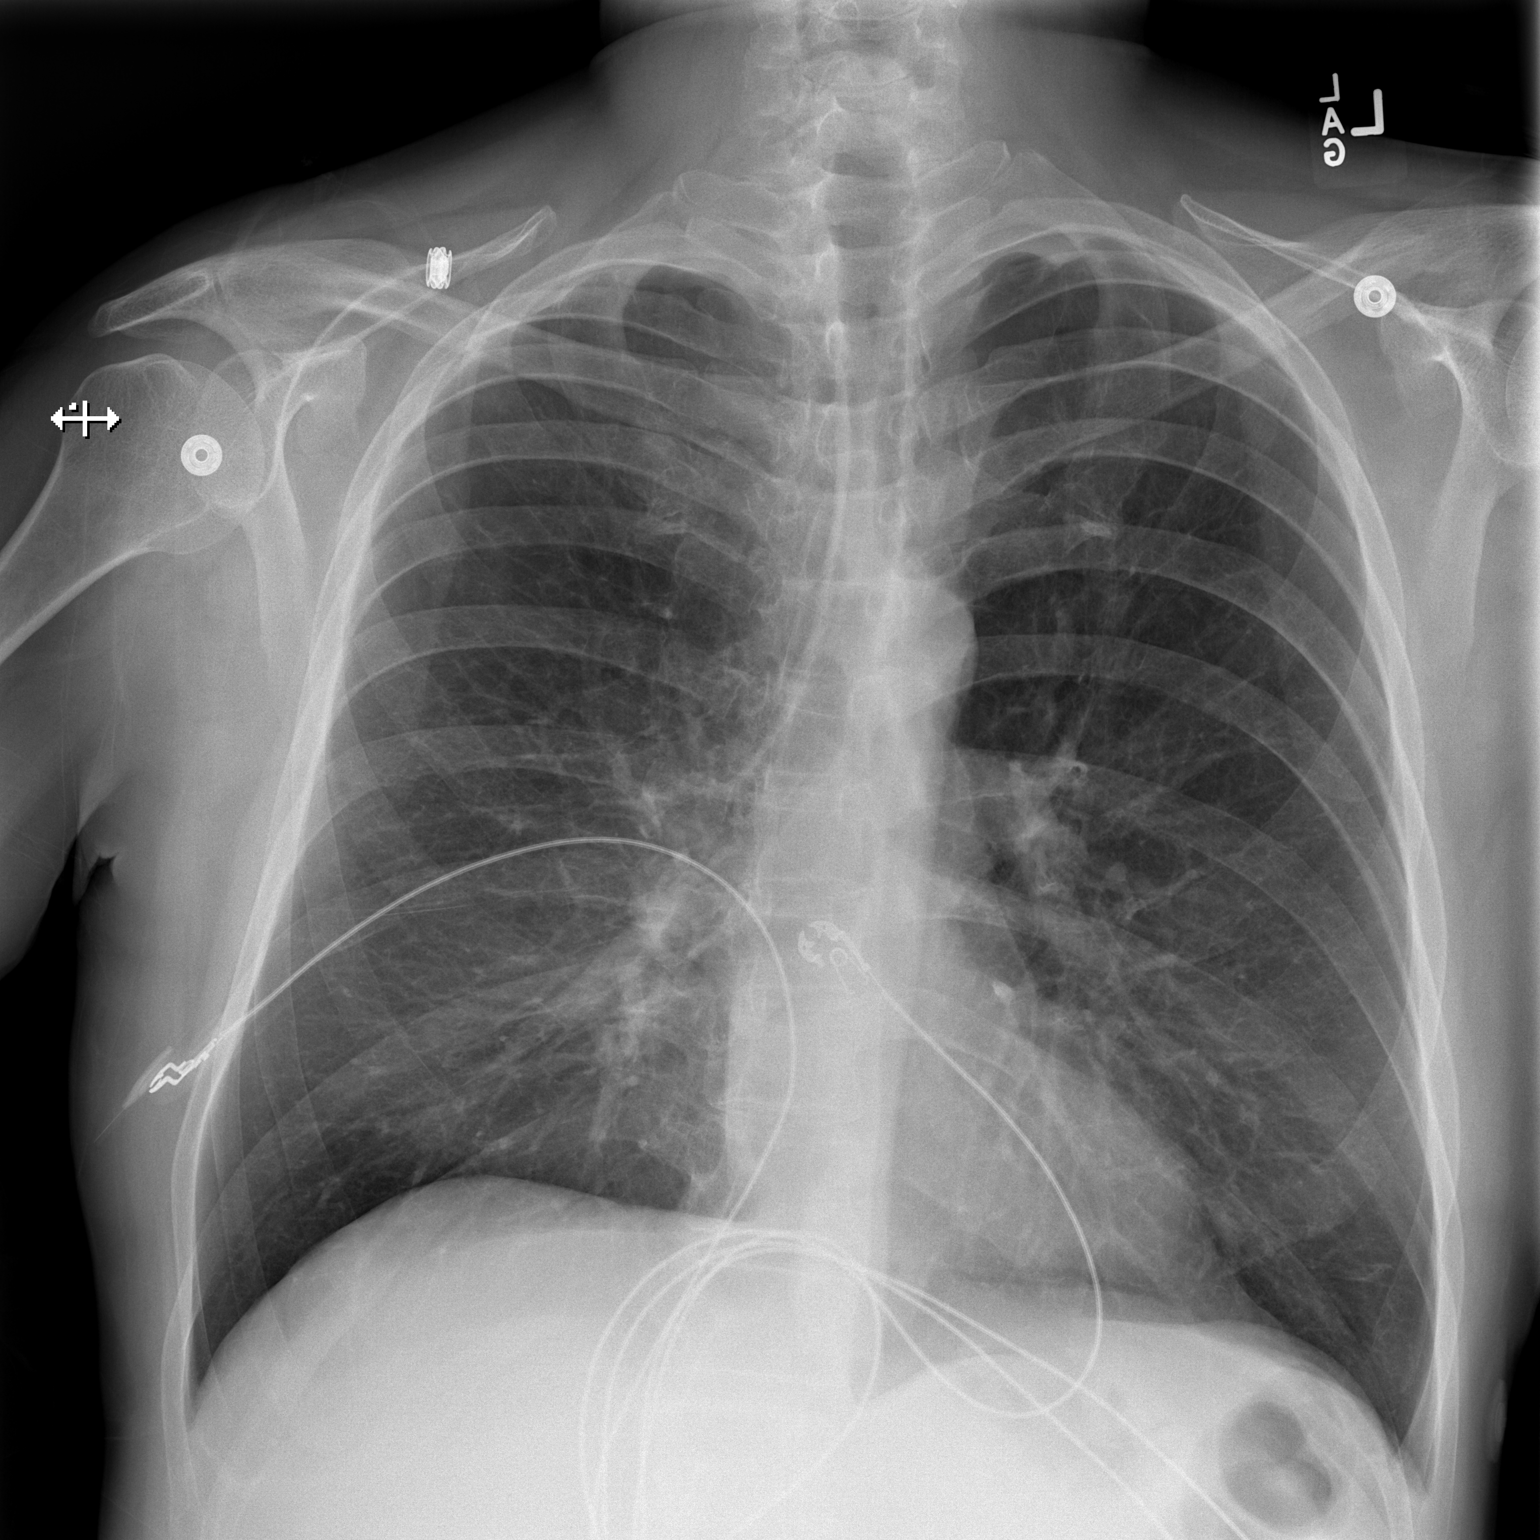

[w chest lat]
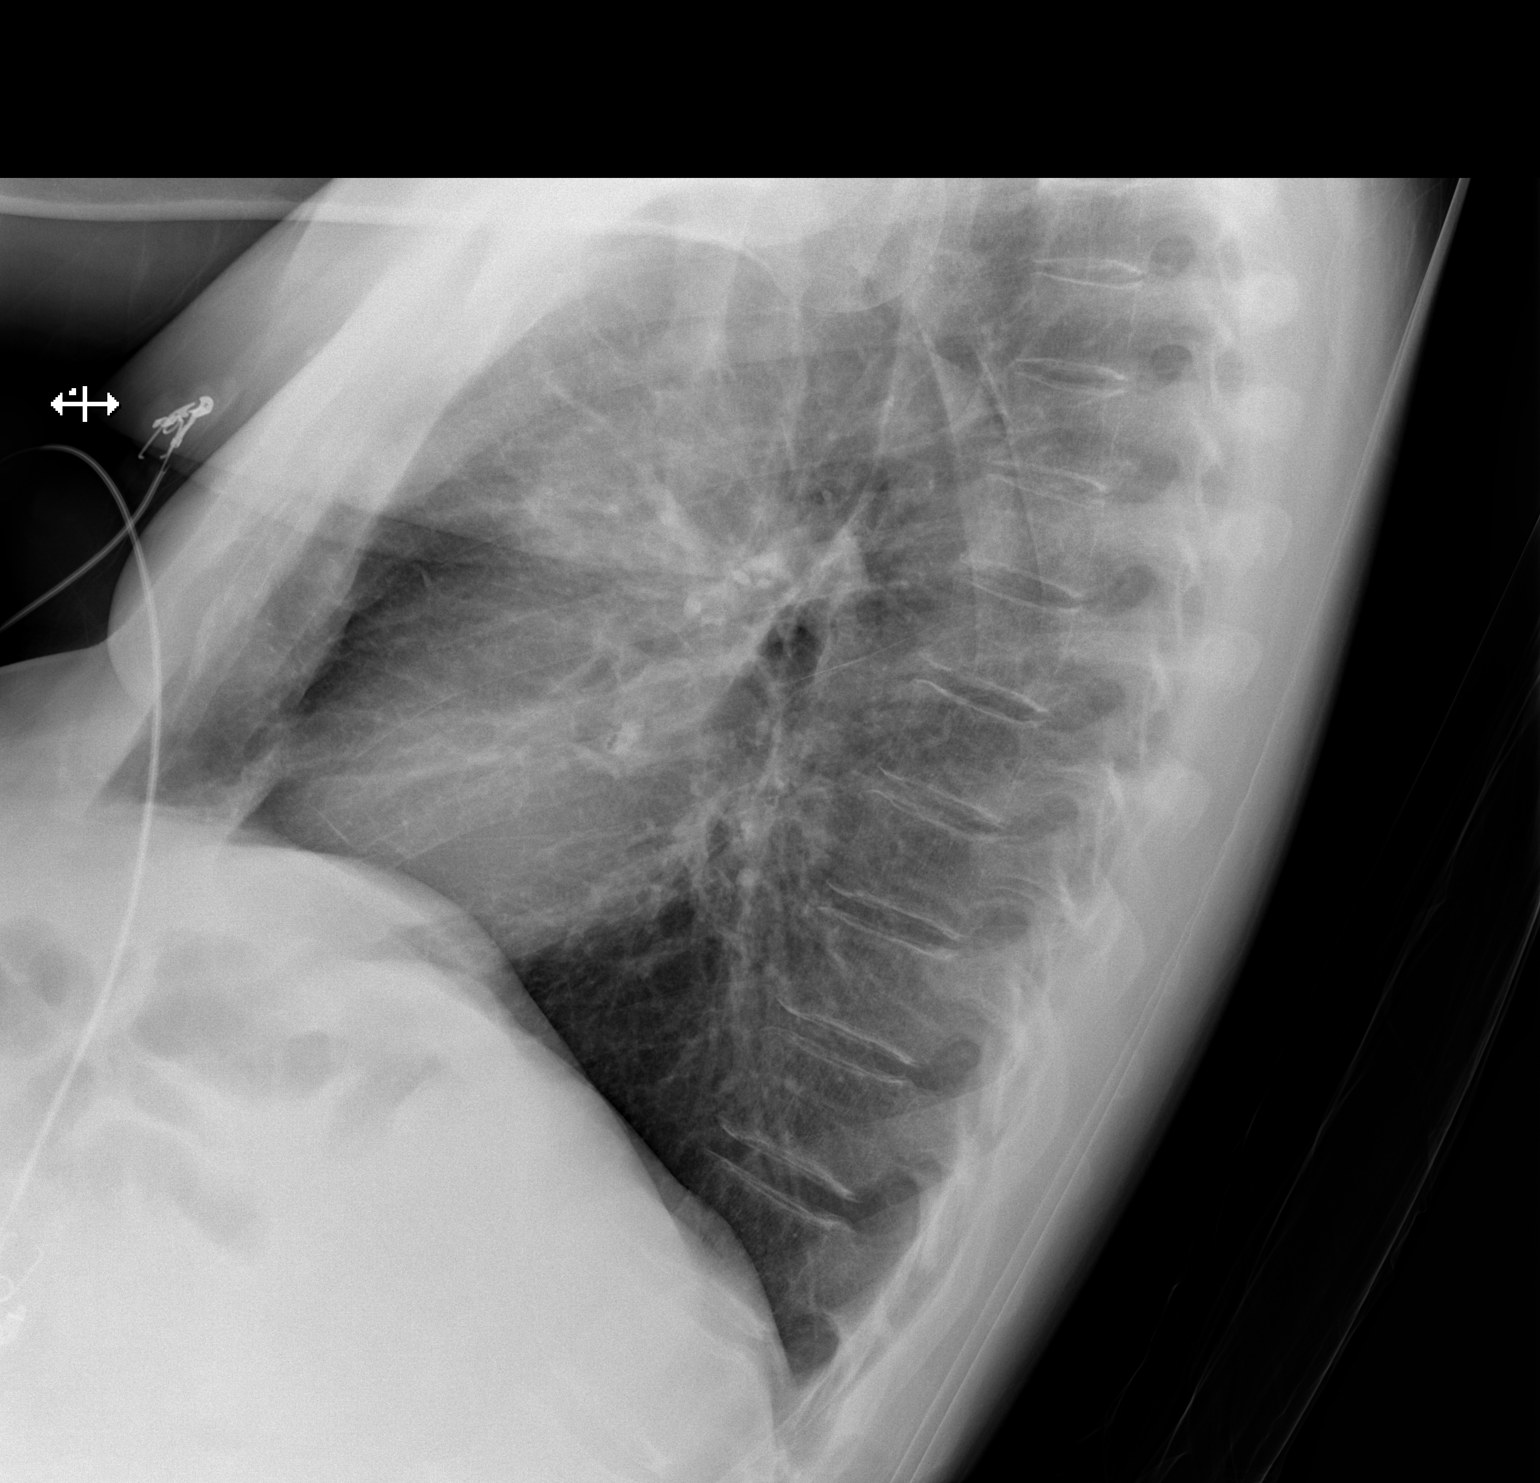

[2 of 2 positions shown; findings below may reference images not displayed]

FINDINGS: The heart size and mediastinal contours are within normal limits.
Both lungs are clear. The visualized skeletal structures are
unremarkable.
IMPRESSION: No active cardiopulmonary disease.
